# Patient Record
Sex: Male | Born: 1995
Health system: Southern US, Community
[De-identification: ages and names within clinical notes are randomized; demographics above are authoritative.]

## PROBLEM LIST (undated history)

## (undated) DIAGNOSIS — J45909 Unspecified asthma, uncomplicated: Secondary | ICD-10-CM

## (undated) HISTORY — PX: HERNIA REPAIR: SHX51

---

## 2015-11-21 ENCOUNTER — Emergency Department (HOSPITAL_COMMUNITY)
Admission: EM | Admit: 2015-11-21 | Discharge: 2015-11-21 | Disposition: A | Discharge status: Home / Self Care | Payer: Medicaid Other | Attending: Emergency Medicine | Admitting: Emergency Medicine

## 2015-11-21 ENCOUNTER — Encounter (HOSPITAL_COMMUNITY): Payer: Self-pay | Admitting: Emergency Medicine

## 2015-11-21 DIAGNOSIS — J45909 Unspecified asthma, uncomplicated: Secondary | ICD-10-CM | POA: Diagnosis present

## 2015-11-21 DIAGNOSIS — Z8739 Personal history of other diseases of the musculoskeletal system and connective tissue: Secondary | ICD-10-CM | POA: Diagnosis not present

## 2015-11-21 DIAGNOSIS — Z7951 Long term (current) use of inhaled steroids: Secondary | ICD-10-CM | POA: Insufficient documentation

## 2015-11-21 DIAGNOSIS — J45901 Unspecified asthma with (acute) exacerbation: Secondary | ICD-10-CM

## 2015-11-21 MED ORDER — MAGNESIUM SULFATE 2 GM/50ML IV SOLN
2.0000 g | Freq: Once | INTRAVENOUS | Status: AC
Start: 2015-11-21 — End: 2015-11-21
  Administered 2015-11-21: 2 g via INTRAVENOUS
  Filled 2015-11-21: qty 50

## 2015-11-21 MED ORDER — ALBUTEROL SULFATE (2.5 MG/3ML) 0.083% IN NEBU
5.0000 mg | INHALATION_SOLUTION | Freq: Once | RESPIRATORY_TRACT | Status: AC
  Administered 2015-11-21: 5 mg via RESPIRATORY_TRACT

## 2015-11-21 MED ORDER — IPRATROPIUM-ALBUTEROL 0.5-2.5 (3) MG/3ML IN SOLN: 3.0000 mL | RESPIRATORY_TRACT | Status: DC

## 2015-11-21 MED ORDER — FLUTICASONE PROPIONATE HFA 110 MCG/ACT IN AERO: 1.0000 | INHALATION_SPRAY | Freq: Two times a day (BID) | RESPIRATORY_TRACT | Status: DC

## 2015-11-21 MED ORDER — ALBUTEROL SULFATE HFA 108 (90 BASE) MCG/ACT IN AERS: 1.0000 | INHALATION_SPRAY | Freq: Four times a day (QID) | RESPIRATORY_TRACT | Status: DC | PRN

## 2015-11-21 MED ORDER — ALBUTEROL SULFATE HFA 108 (90 BASE) MCG/ACT IN AERS
2.0000 | INHALATION_SPRAY | RESPIRATORY_TRACT | Status: DC
  Administered 2015-11-21: 2 via RESPIRATORY_TRACT
  Filled 2015-11-21: qty 6.7

## 2015-11-21 MED ORDER — METHYLPREDNISOLONE SODIUM SUCC 125 MG IJ SOLR
125.0000 mg | Freq: Once | INTRAMUSCULAR | Status: AC
  Administered 2015-11-21: 125 mg via INTRAVENOUS
  Filled 2015-11-21: qty 2

## 2015-11-21 MED ORDER — PREDNISONE 20 MG PO TABS: 20.0000 mg | ORAL_TABLET | Freq: Two times a day (BID) | ORAL | Status: DC

## 2015-11-21 MED ORDER — ALBUTEROL (5 MG/ML) CONTINUOUS INHALATION SOLN
10.0000 mg/h | INHALATION_SOLUTION | Freq: Once | RESPIRATORY_TRACT | Status: AC
  Administered 2015-11-21: 10 mg/h via RESPIRATORY_TRACT
  Filled 2015-11-21: qty 20

## 2015-11-21 MED ORDER — ALBUTEROL SULFATE (2.5 MG/3ML) 0.083% IN NEBU
INHALATION_SOLUTION | RESPIRATORY_TRACT | Status: AC
  Filled 2015-11-21: qty 6

## 2015-11-21 MED ORDER — IPRATROPIUM-ALBUTEROL 0.5-2.5 (3) MG/3ML IN SOLN
3.0000 mL | Freq: Once | RESPIRATORY_TRACT | Status: AC
  Administered 2015-11-21: 3 mL via RESPIRATORY_TRACT
  Filled 2015-11-21: qty 3

## 2015-11-21 NOTE — ED Notes (Signed)
Dr. James MD at bedside. 

## 2015-11-21 NOTE — ED Notes (Signed)
Pt arrives from home reporting intermittent wheezing since Thursday.  Pt reports being out of home meds d/t insurance complications.  Inspiratory and expiratory wheezes noted throughout.  Pt unable to speak in full sentences.

## 2015-11-21 NOTE — ED Notes (Signed)
MD at bedside. 

## 2015-11-21 NOTE — Discharge Instructions (Signed)
Asthma, Adult Asthma is a condition of the lungs in which the airways tighten and narrow. Asthma can make it hard to breathe. Asthma cannot be cured, but medicine and lifestyle changes can help control it. Asthma may be started (triggered) by:  Animal skin flakes (dander).  Dust.  Cockroaches.  Pollen.  Mold.  Smoke.  Cleaning products.  Hair sprays or aerosol sprays.  Paint fumes or strong smells.  Cold air, weather changes, and winds.  Crying or laughing hard.  Stress.  Certain medicines or drugs.  Foods, such as dried fruit, potato chips, and sparkling grape juice.  Infections or conditions (colds, flu).  Exercise.  Certain medical conditions or diseases.  Exercise or tiring activities. HOME CARE   Take medicine as told by your doctor.  Use a peak flow meter as told by your doctor. A peak flow meter is a tool that measures how well the lungs are working.  Record and keep track of the peak flow meter's readings.  Understand and use the asthma action plan. An asthma action plan is a written plan for taking care of your asthma and treating your attacks.  To help prevent asthma attacks:  Do not smoke. Stay away from secondhand smoke.  Change your heating and air conditioning filter often.  Limit your use of fireplaces and wood stoves.  Get rid of pests (such as roaches and mice) and their droppings.  Throw away plants if you see mold on them.  Clean your floors. Dust regularly. Use cleaning products that do not smell.  Have someone vacuum when you are not home. Use a vacuum cleaner with a HEPA filter if possible.  Replace carpet with wood, tile, or vinyl flooring. Carpet can trap animal skin flakes and dust.  Use allergy-proof pillows, mattress covers, and box spring covers.  Wash bed sheets and blankets every week in hot water and dry them in a dryer.  Use blankets that are made of polyester or cotton.  Clean bathrooms and kitchens with bleach.  If possible, have someone repaint the walls in these rooms with mold-resistant paint. Keep out of the rooms that are being cleaned and painted.  Wash hands often. GET HELP IF:  You have make a whistling sound when breaking (wheeze), have shortness of breath, or have a cough even if taking medicine to prevent attacks.  The colored mucus you cough up (sputum) is thicker than usual.  The colored mucus you cough up changes from clear or white to yellow, green, gray, or bloody.  You have problems from the medicine you are taking such as:  A rash.  Itching.  Swelling.  Trouble breathing.  You need reliever medicines more than 2-3 times a week.  Your peak flow measurement is still at 50-79% of your personal best after following the action plan for 1 hour.  You have a fever. GET HELP RIGHT AWAY IF:   You seem to be worse and are not responding to medicine during an asthma attack.  You are short of breath even at rest.  You get short of breath when doing very little activity.  You have trouble eating, drinking, or talking.  You have chest pain.  You have a fast heartbeat.  Your lips or fingernails start to turn blue.  You are light-headed, dizzy, or faint.  Your peak flow is less than 50% of your personal best.   This information is not intended to replace advice given to you by your health care provider. Make sure   you discuss any questions you have with your health care provider.   Document Released: 03/13/2008 Document Revised: 06/16/2015 Document Reviewed: 04/24/2013 Elsevier Interactive Patient Education 2016 Elsevier Inc.  

## 2015-11-21 NOTE — ED Provider Notes (Signed)
CSN: [id number]     Arrival date & time 11/21/15  0703 History   First MD Initiated Contact with Patient 11/21/15 [phone]     Chief Complaint  Patient presents with  . Asthma      HPI  Patient presents for evaluation of shortness of breath. History of asthma. Wheezing for the last 4-5 days. Out of his home nebulized albuterol solution. Wheezing and frequent cough area 2-3 word dyspnea. Presents here.  Past Medical History  Diagnosis Date  . Arthritis    Past Surgical History  Procedure Laterality Date  . Hernia repair     History reviewed. No pertinent family history. Social History  Substance Use Topics  . Smoking status: Never Smoker   . Smokeless tobacco: None  . Alcohol Use: Yes     Comment: occasionally    Review of Systems  Constitutional: Negative for fever, chills, diaphoresis, appetite change and fatigue.  HENT: Negative for mouth sores, sore throat and trouble swallowing.   Eyes: Negative for visual disturbance.  Respiratory: Positive for cough, shortness of breath and wheezing. Negative for chest tightness.   Cardiovascular: Negative for chest pain.  Gastrointestinal: Negative for nausea, vomiting, abdominal pain, diarrhea and abdominal distention.  Endocrine: Negative for polydipsia, polyphagia and polyuria.  Genitourinary: Negative for dysuria, frequency and hematuria.  Musculoskeletal: Negative for gait problem.  Skin: Negative for color change, pallor and rash.  Neurological: Negative for dizziness, syncope, light-headedness and headaches.  Hematological: Does not bruise/bleed easily.  Psychiatric/Behavioral: Negative for behavioral problems and confusion.      Allergies  Shellfish allergy  Home Medications   Prior to Admission medications   Medication Sig Start Date End Date Taking? Authorizing Provider  beclomethasone (QVAR) 80 MCG/ACT inhaler Inhale 2 puffs into the lungs 2 (two) times daily.   Yes Historical Provider, MD  albuterol (PROVENTIL  HFA;VENTOLIN HFA) 108 (90 Base) MCG/ACT inhaler Inhale 1-2 puffs into the lungs every 6 (six) hours as needed for wheezing. 11/21/15   Rolland Porter, MD  fluticasone (FLOVENT HFA) 110 MCG/ACT inhaler Inhale 1 puff into the lungs 2 (two) times daily. 11/21/15   Rolland Porter, MD  predniSONE (DELTASONE) 20 MG tablet Take 1 tablet (20 mg total) by mouth 2 (two) times daily with a meal. 11/21/15   Rolland Porter, MD   BP 134/67 mmHg  Pulse 97  Temp(Src) 98.3 F (36.8 C) (Oral)  Resp 22  SpO2 97% Physical Exam  Constitutional: He is oriented to person, place, and time. He appears well-developed and well-nourished. No distress.  HENT:  Head: Normocephalic.  Eyes: Conjunctivae are normal. Pupils are equal, round, and reactive to light. No scleral icterus.  Neck: Normal range of motion. Neck supple. No thyromegaly present.  Cardiovascular: Normal rate and regular rhythm.  Exam reveals no gallop and no friction rub.   No murmur heard. Pulmonary/Chest: He is in respiratory distress. He has decreased breath sounds in the right upper field, the right middle field, the right lower field, the left upper field, the left middle field and the left lower field. He has wheezes in the right upper field, the right middle field, the right lower field, the left upper field, the left middle field and the left lower field. He has no rales.  Abdominal: Soft. Bowel sounds are normal. He exhibits no distension. There is no tenderness. There is no rebound.  Musculoskeletal: Normal range of motion.  Neurological: He is alert and oriented to person, place, and time.  Skin: Skin is warm  and dry. No rash noted.  Psychiatric: He has a normal mood and affect. His behavior is normal.    ED Course  Procedures (including critical care time) Labs Review Labs Reviewed - No data to display  Imaging Review No results found. I have personally reviewed and evaluated these images and lab results as part of my medical decision-making.    EKG Interpretation None      MDM   Final diagnoses:  Asthma exacerbation    Nebulizer albuterol started by nursing upon arrival. After my exam, I have requested a continuous 10 mg neb and IV Solu-Medrol. We will reevaluate after bronchodilators.  Given 10 mg continuous. The observed for one hour. Given an additional DuoNeb. Had marked improvement. He has some residual index for wheezing. Well oxygenated 95%. Resting sleeping without desaturation. He has a strong desire not to stay in the hospital. Is not unreasonable. Plan is home. Minimize his activity. Prednisone, Flovent, albuterol, recheck any worsening. CRITICAL CARE Performed by: Rolland Porter JOSEPH   Total critical care time: 65 minutes  Critical care time was exclusive of separately billable procedures and treating other patients.  Critical care was necessary to treat or prevent imminent or life-threatening deterioration.  Critical care was time spent personally by me on the following activities: development of treatment plan with patient and/or surrogate as well as nursing, discussions with consultants, evaluation of patient's response to treatment, examination of patient, obtaining history from patient or surrogate, ordering and performing treatments and interventions, ordering and review of laboratory studies, ordering and review of radiographic studies, pulse oximetry and re-evaluation of patient's condition. care    Rolland Porter, MD 11/21/15 1150

## 2015-12-26 ENCOUNTER — Emergency Department (HOSPITAL_COMMUNITY)
Admission: EM | Admit: 2015-12-26 | Discharge: 2015-12-26 | Disposition: A | Discharge status: Home / Self Care | Payer: Medicaid Other | Attending: Emergency Medicine | Admitting: Emergency Medicine

## 2015-12-26 ENCOUNTER — Encounter (HOSPITAL_COMMUNITY): Payer: Self-pay | Admitting: Emergency Medicine

## 2015-12-26 DIAGNOSIS — Z7952 Long term (current) use of systemic steroids: Secondary | ICD-10-CM | POA: Insufficient documentation

## 2015-12-26 DIAGNOSIS — Z79899 Other long term (current) drug therapy: Secondary | ICD-10-CM | POA: Insufficient documentation

## 2015-12-26 DIAGNOSIS — J45901 Unspecified asthma with (acute) exacerbation: Secondary | ICD-10-CM | POA: Insufficient documentation

## 2015-12-26 DIAGNOSIS — L509 Urticaria, unspecified: Secondary | ICD-10-CM | POA: Insufficient documentation

## 2015-12-26 DIAGNOSIS — Z7951 Long term (current) use of inhaled steroids: Secondary | ICD-10-CM | POA: Diagnosis not present

## 2015-12-26 DIAGNOSIS — R21 Rash and other nonspecific skin eruption: Secondary | ICD-10-CM | POA: Diagnosis present

## 2015-12-26 MED ORDER — LORATADINE 10 MG PO TABS: 10.0000 mg | ORAL_TABLET | Freq: Every day | ORAL | Status: DC

## 2015-12-26 MED ORDER — PREDNISONE 50 MG PO TABS: 50.0000 mg | ORAL_TABLET | Freq: Every day | ORAL | Status: DC

## 2015-12-26 MED ORDER — LORATADINE 10 MG PO TABS
10.0000 mg | ORAL_TABLET | Freq: Every day | ORAL | Status: DC
  Administered 2015-12-26: 10 mg via ORAL
  Filled 2015-12-26: qty 1

## 2015-12-26 MED ORDER — EPINEPHRINE 0.3 MG/0.3ML IJ SOAJ: 0.3000 mg | Freq: Once | INTRAMUSCULAR | Status: DC

## 2015-12-26 MED ORDER — PREDNISONE 20 MG PO TABS
60.0000 mg | ORAL_TABLET | Freq: Once | ORAL | Status: AC
  Administered 2015-12-26: 60 mg via ORAL
  Filled 2015-12-26: qty 3

## 2015-12-26 NOTE — ED Notes (Addendum)
Pt c/o hives all over body. Pt reports history of same "since 8th grade when the weather gets hot outside." Pt reports itching. Hives noted to face, B/L eyes and arms. Pt denies use of new products.  Pt reports shortness of breath. 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

## 2015-12-26 NOTE — Discharge Instructions (Signed)
Hives Hives are itchy, red, swollen areas of the skin. They can vary in size and location on your body. Hives can come and go for hours or several days (acute hives) or for several weeks (chronic hives). Hives do not spread from person to person (noncontagious). They may get worse with scratching, exercise, and emotional stress. CAUSES   Allergic reaction to food, additives, or drugs.  Infections, including the common cold.  Illness, such as vasculitis, lupus, or thyroid disease.  Exposure to sunlight, heat, or cold.  Exercise.  Stress.  Contact with chemicals. SYMPTOMS   Red or white swollen patches on the skin. The patches may change size, shape, and location quickly and repeatedly.  Itching.  Swelling of the hands, feet, and face. This may occur if hives develop deeper in the skin. DIAGNOSIS  Your caregiver can usually tell what is wrong by performing a physical exam. Skin or blood tests may also be done to determine the cause of your hives. In some cases, the cause cannot be determined. TREATMENT  Mild cases usually get better with medicines such as antihistamines. Severe cases may require an emergency epinephrine injection. If the cause of your hives is known, treatment includes avoiding that trigger.  HOME CARE INSTRUCTIONS   Avoid causes that trigger your hives.  Take antihistamines as directed by your caregiver to reduce the severity of your hives. Non-sedating or low-sedating antihistamines are usually recommended. Do not drive while taking an antihistamine.  Take any other medicines prescribed for itching as directed by your caregiver.  Wear loose-fitting clothing.  Keep all follow-up appointments as directed by your caregiver. SEEK MEDICAL CARE IF:   You have persistent or severe itching that is not relieved with medicine.  You have painful or swollen joints. SEEK IMMEDIATE MEDICAL CARE IF:   You have a fever.  Your tongue or lips are swollen.  You have  trouble breathing or swallowing.  You feel tightness in the throat or chest.  You have abdominal pain. These problems may be the first sign of a life-threatening allergic reaction. Call your local emergency services (911 in [place] MAKE SURE YOU:   Understand these instructions.  Will watch your condition.  Will get help right away if you are not doing well or get worse.   This information is not intended to replace advice given to you by your health care provider. Make sure you discuss any questions you have with your health care provider.   Document Released: 09/25/2005 Document Revised: 09/30/2013 Document Reviewed: 12/19/2011 Elsevier Interactive Patient Education 2016 Elsevier Inc.  

## 2015-12-26 NOTE — ED Provider Notes (Signed)
CSN: [id number]     Arrival date & time 12/26/15  1728 History   First MD Initiated Contact with Patient 12/26/15 1809     Chief Complaint  Patient presents with  . Urticaria    HPI Has had a similar rash off and on since 8th year.    He has a seafood allergy.  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  These symptoms stgarted again a few days ago.  When it starts initially he will notice a rash on his arms that starts spreading.  The rashes itches.  He feels a little short of breath when it happens.  He gets swelling around the face.  He did not  take anything but it has gotten a little bit better than it was earlier today. He does have a history of asthma. History reviewed. No pertinent past medical history. Past Surgical History  Procedure Laterality Date  . Hernia repair     No family history on file. Social History  Substance Use Topics  . Smoking status: Never Smoker   . Smokeless tobacco: None  . Alcohol Use: Yes     Comment: occasionally    Review of Systems  All other systems reviewed and are negative.     Allergies  Shellfish allergy  Home Medications   Prior to Admission medications   Medication Sig Start Date End Date Taking? Authorizing Provider  albuterol (PROVENTIL HFA;VENTOLIN HFA) 108 (90 Base) MCG/ACT inhaler Inhale 1-2 puffs into the lungs every 6 (six) hours as needed for wheezing. 11/21/15   Rolland PorterMark James, MD  beclomethasone (QVAR) 80 MCG/ACT inhaler Inhale 2 puffs into the lungs 2 (two) times daily.    Historical Provider, MD  fluticasone (FLOVENT HFA) 110 MCG/ACT inhaler Inhale 1 puff into the lungs 2 (two) times daily. 11/21/15   Rolland PorterMark James, MD  predniSONE (DELTASONE) 20 MG tablet Take 1 tablet (20 mg total) by mouth 2 (two) times daily with a meal. 11/21/15   Rolland PorterMark James, MD   BP 129/85 mmHg  Pulse 119  Temp(Src) 98.7 F (37.1 C) (Oral)  Resp 18  Ht 5\' 7"  (1.702 m)  Wt 67.359 kg  BMI 23.25 kg/m2  SpO2  94% Physical Exam  Constitutional: He appears well-developed and well-nourished. No distress.  HENT:  Head: Normocephalic and atraumatic.  Right Ear: External ear normal.  Left Ear: External ear normal.  Mouth/Throat: No oropharyngeal exudate.  No uvula edema, no angioedema   Eyes: Conjunctivae are normal. Right eye exhibits no discharge. Left eye exhibits no discharge. No scleral icterus.  Neck: Neck supple. No tracheal deviation present.  Cardiovascular: Normal rate, regular rhythm and intact distal pulses.   Pulmonary/Chest: Effort normal and breath sounds normal. No stridor. No respiratory distress. He has no wheezes. He has no rales.  Abdominal: Soft. Bowel sounds are normal. He exhibits no distension. There is no tenderness. There is no rebound and no guarding.  Musculoskeletal: He exhibits no edema or tenderness.  Neurological: He is alert. He has normal strength. No cranial nerve deficit (no facial droop, extraocular movements intact, no slurred speech) or sensory deficit. He exhibits normal muscle tone. He displays no seizure activity. Coordination normal.  Skin: Skin is warm and dry. Rash noted.  Mild urticaria rash   Psychiatric: He has a normal mood and affect.  Nursing note and vitals reviewed.   ED Course  Procedures (including critical care time)   MDM   Final  diagnoses:  Urticaria    Pt has mild urticaria on exam which seems to be improving.  Unclear what the trigger is.  Will give steroids and antihistamines.  Discussed outpatient referral to an allergist.    Linwood Dibbles, MD 12/26/15 [phone]

## 2017-01-28 DIAGNOSIS — J45909 Unspecified asthma, uncomplicated: Secondary | ICD-10-CM | POA: Insufficient documentation

## 2017-01-28 DIAGNOSIS — Z79899 Other long term (current) drug therapy: Secondary | ICD-10-CM | POA: Diagnosis not present

## 2017-01-28 DIAGNOSIS — J4541 Moderate persistent asthma with (acute) exacerbation: Secondary | ICD-10-CM | POA: Insufficient documentation

## 2017-01-28 DIAGNOSIS — R0602 Shortness of breath: Secondary | ICD-10-CM | POA: Diagnosis present

## 2017-01-29 ENCOUNTER — Encounter (HOSPITAL_COMMUNITY): Payer: Self-pay

## 2017-01-29 ENCOUNTER — Emergency Department (HOSPITAL_COMMUNITY): Payer: Medicaid Other

## 2017-01-29 ENCOUNTER — Emergency Department (HOSPITAL_COMMUNITY)
Admission: EM | Admit: 2017-01-29 | Discharge: 2017-01-29 | Disposition: A | Discharge status: Home / Self Care | Payer: Medicaid Other | Attending: Emergency Medicine | Admitting: Emergency Medicine

## 2017-01-29 DIAGNOSIS — J4541 Moderate persistent asthma with (acute) exacerbation: Secondary | ICD-10-CM

## 2017-01-29 HISTORY — DX: Unspecified asthma, uncomplicated: J45.909

## 2017-01-29 MED ORDER — PREDNISONE 20 MG PO TABS
60.0000 mg | ORAL_TABLET | Freq: Once | ORAL | Status: AC
  Administered 2017-01-29: 60 mg via ORAL
  Filled 2017-01-29: qty 3

## 2017-01-29 MED ORDER — PREDNISONE 10 MG PO TABS: 60.0000 mg | ORAL_TABLET | Freq: Every day | ORAL | 0 refills | Status: DC

## 2017-01-29 MED ORDER — ALBUTEROL SULFATE HFA 108 (90 BASE) MCG/ACT IN AERS: 2.0000 | INHALATION_SPRAY | RESPIRATORY_TRACT | 0 refills | Status: DC | PRN

## 2017-01-29 MED ORDER — ALBUTEROL SULFATE (2.5 MG/3ML) 0.083% IN NEBU
INHALATION_SOLUTION | RESPIRATORY_TRACT | Status: AC
  Filled 2017-01-29: qty 6

## 2017-01-29 MED ORDER — ALBUTEROL SULFATE (2.5 MG/3ML) 0.083% IN NEBU
5.0000 mg | INHALATION_SOLUTION | Freq: Once | RESPIRATORY_TRACT | Status: AC
  Administered 2017-01-29: 5 mg via RESPIRATORY_TRACT

## 2017-01-29 MED ORDER — ALBUTEROL SULFATE HFA 108 (90 BASE) MCG/ACT IN AERS
2.0000 | INHALATION_SPRAY | RESPIRATORY_TRACT | Status: DC
  Administered 2017-01-29: 2 via RESPIRATORY_TRACT
  Filled 2017-01-29: qty 6.7

## 2017-01-29 NOTE — ED Notes (Signed)
Patient transported to X-ray 

## 2017-01-29 NOTE — ED Triage Notes (Signed)
Pt states he is having asthma flare x 2 days; pt states out of inhaler at home; pt presents with wheezes in all fields; pt c/o chest discomfort from breathing at 6/10; pt a&ox 4 on arrival. Pt c/o SOB.

## 2017-01-29 NOTE — ED Notes (Signed)
Pt returned from xray

## 2017-01-29 NOTE — ED Provider Notes (Signed)
[facility]-EMERGENCY DEPT Provider Note   CSN: [id number] Arrival date & time: 01/28/17  2355  By signing my name below, I, Elder Negus, attest that this documentation has been prepared under the direction and in the presence of Azalia Bilis, MD. Electronically Signed: Elder Negus, Scribe. 01/29/17. 12:34 AM.   History   Chief Complaint Chief Complaint  Patient presents with  . Asthma  . Shortness of Breath    HPI Mario Griffith is a 21 y.o. male with history of asthma who presents to the ED for evaluation of dyspnea. This patient states that in the last 48 hours he has experienced increased shortness of breath which has not been improved on inhaler treatments. He has also had productive cough. No fever. No chest pain.  The history is provided by the patient. No language interpreter was used.    Past Medical History:  Diagnosis Date  . Asthma     There are no active problems to display for this patient.   Past Surgical History:  Procedure Laterality Date  . HERNIA REPAIR         Home Medications    Prior to Admission medications   Medication Sig Start Date End Date Taking? Authorizing Provider  albuterol (PROVENTIL HFA;VENTOLIN HFA) 108 (90 Base) MCG/ACT inhaler Inhale 1-2 puffs into the lungs every 6 (six) hours as needed for wheezing. 11/21/15   Rolland Porter, MD  beclomethasone (QVAR) 80 MCG/ACT inhaler Inhale 2 puffs into the lungs 2 (two) times daily.    Historical Provider, MD  EPINEPHrine 0.3 mg/0.3 mL IJ SOAJ injection Inject 0.3 mLs (0.3 mg total) into the muscle once. 12/26/15   Linwood Dibbles, MD  fluticasone (FLOVENT HFA) 110 MCG/ACT inhaler Inhale 1 puff into the lungs 2 (two) times daily. 11/21/15   Rolland Porter, MD  loratadine (CLARITIN) 10 MG tablet Take 1 tablet (10 mg total) by mouth daily. 12/26/15   Linwood Dibbles, MD  predniSONE (DELTASONE) 50 MG tablet Take 1 tablet (50 mg total) by mouth daily with breakfast. 12/26/15   Linwood Dibbles, MD    Family History No  family history on file.  Social History Social History  Substance Use Topics  . Smoking status: Never Smoker  . Smokeless tobacco: Not on file  . Alcohol use Yes     Comment: occasionally     Allergies   Shellfish allergy   Review of Systems Review of Systems All systems reviewed and are negative for acute change except as noted in the HPI.  Physical Exam Updated Vital Signs BP 126/82 (BP Location: Right Arm)   Pulse 91   Temp 98.7 F (37.1 C) (Oral)   Resp 18   SpO2 100%   Physical Exam  Constitutional: He is oriented to person, place, and time. He appears well-developed and well-nourished.  HENT:  Head: Normocephalic and atraumatic.  Eyes: EOM are normal.  Neck: Normal range of motion.  Cardiovascular: Normal rate, regular rhythm, normal heart sounds and intact distal pulses.   Pulmonary/Chest: Effort normal. No respiratory distress.  Mild wheezing bilaterally.   Abdominal: Soft. He exhibits no distension. There is no tenderness.  Musculoskeletal: Normal range of motion.  Neurological: He is alert and oriented to person, place, and time.  Skin: Skin is warm and dry.  Psychiatric: He has a normal mood and affect. Judgment normal.  Nursing note and vitals reviewed.    ED Treatments / Results  Labs (all labs ordered are listed, but only abnormal results are displayed) Labs Reviewed -  No data to display  EKG  EKG Interpretation  Date/Time:  Monday January 29 2017 00:18:27 EDT Ventricular Rate:  91 PR Interval:  178 QRS Duration: 90 QT Interval:  344 QTC Calculation: 423 R Axis:   -10 Text Interpretation:  Normal sinus rhythm with sinus arrhythmia Biatrial enlargement Pulmonary disease pattern Abnormal ECG No old tracing to compare Confirmed by Shanikia Kernodle  MD, Caryn Bee ([id number]) on 01/29/2017 1:54:43 AM       Radiology Dg Chest 2 View  Result Date: 01/29/2017 CLINICAL DATA:  Dyspnea and cough.  Chest pain x2 days. EXAM: CHEST  2 VIEW COMPARISON:  None. FINDINGS:  The heart size and mediastinal contours are within normal limits. Both lungs are clear. The visualized skeletal structures are unremarkable. IMPRESSION: No active cardiopulmonary disease. Electronically Signed   By: Tollie Eth M.D.   On: 01/29/2017 01:40    Procedures Procedures (including critical care time)  Medications Ordered in ED Medications  albuterol (PROVENTIL) (2.5 MG/3ML) 0.083% nebulizer solution (not administered)  albuterol (PROVENTIL HFA;VENTOLIN HFA) 108 (90 Base) MCG/ACT inhaler 2 puff (not administered)  albuterol (PROVENTIL) (2.5 MG/3ML) 0.083% nebulizer solution 5 mg (5 mg Nebulization Given 01/29/17 0021)  predniSONE (DELTASONE) tablet 60 mg (60 mg Oral Given 01/29/17 0042)     Initial Impression / Assessment and Plan / ED Course  I have reviewed the triage vital signs and the nursing notes.  Pertinent labs & imaging results that were available during my care of the patient were reviewed by me and considered in my medical decision making (see chart for details).     Patient feels much better this time.  Discharge home with prednisone and albuterol.  He understands return the ER for new or worsening symptoms  Final Clinical Impressions(s) / ED Diagnoses   Final diagnoses:  Moderate persistent asthma with exacerbation    New Prescriptions New Prescriptions   ALBUTEROL (PROVENTIL HFA;VENTOLIN HFA) 108 (90 BASE) MCG/ACT INHALER    Inhale 2 puffs into the lungs every 4 (four) hours as needed for wheezing or shortness of breath.   PREDNISONE (DELTASONE) 10 MG TABLET    Take 6 tablets (60 mg total) by mouth daily.  I personally performed the services described in this documentation, which was scribed in my presence. The recorded information has been reviewed and is accurate.       Azalia Bilis, MD 01/29/17 [id number]

## 2017-05-11 ENCOUNTER — Emergency Department (HOSPITAL_COMMUNITY)
Admission: EM | Admit: 2017-05-11 | Discharge: 2017-05-12 | Disposition: A | Discharge status: Home / Self Care | Payer: Medicaid Other | Attending: Emergency Medicine | Admitting: Emergency Medicine

## 2017-05-11 ENCOUNTER — Encounter (HOSPITAL_COMMUNITY): Payer: Self-pay | Admitting: *Deleted

## 2017-05-11 DIAGNOSIS — J45909 Unspecified asthma, uncomplicated: Secondary | ICD-10-CM | POA: Insufficient documentation

## 2017-05-11 DIAGNOSIS — Z91013 Allergy to seafood: Secondary | ICD-10-CM | POA: Insufficient documentation

## 2017-05-11 DIAGNOSIS — Z79899 Other long term (current) drug therapy: Secondary | ICD-10-CM | POA: Insufficient documentation

## 2017-05-11 DIAGNOSIS — Z760 Encounter for issue of repeat prescription: Secondary | ICD-10-CM | POA: Insufficient documentation

## 2017-05-11 MED ORDER — ALBUTEROL SULFATE HFA 108 (90 BASE) MCG/ACT IN AERS: 1.0000 | INHALATION_SPRAY | Freq: Four times a day (QID) | RESPIRATORY_TRACT | 0 refills | Status: DC | PRN

## 2017-05-11 MED ORDER — ALBUTEROL SULFATE HFA 108 (90 BASE) MCG/ACT IN AERS
2.0000 | INHALATION_SPRAY | Freq: Once | RESPIRATORY_TRACT | Status: AC
  Administered 2017-05-12: 2 via RESPIRATORY_TRACT
  Filled 2017-05-11: qty 6.7

## 2017-05-11 NOTE — ED Triage Notes (Signed)
The pt is here from [place] hes in school  He lost his medicaid in April he has asthma but he is not having any problem  Breathing.  hes here to fer rxs forf asthma  He has not had meds since april

## 2017-05-11 NOTE — ED Provider Notes (Signed)
[facility] DEPT Provider Note   CSN: [id number] Arrival date & time: 05/11/17  2313     History   Chief Complaint Chief Complaint  Patient presents with  . Medication Refill    HPI Mario Griffith is a 21 y.o. male with history of asthma, presenting for medication refill of albuterol inhaler. Patient states he has not had albuterol since his last ED visit in April. He states he has been having intermittent wheezing for the past few weeks, and has been using her family members inhaler. He states he moved to [place] for school, [facility], however he lost his primary care when he turned 18. He states he also recently lost his [employer] insurance. He reports intermittent wheezing, and cough in the evening. He states he is not currently short of breath or having difficulty breathing in the ER. No other complaints today.  The history is provided by the patient.    Past Medical History:  Diagnosis Date  . Asthma     There are no active problems to display for this patient.   Past Surgical History:  Procedure Laterality Date  . HERNIA REPAIR         Home Medications    Prior to Admission medications   Medication Sig Start Date End Date Taking? Authorizing Provider  albuterol (PROVENTIL HFA;VENTOLIN HFA) 108 (90 Base) MCG/ACT inhaler Inhale 1-2 puffs into the lungs every 6 (six) hours as needed for wheezing or shortness of breath. 05/11/17   Russo, [place]azil[place] N, PA-C  EPINEPHrine 0.3 mg/0.3 mL IJ SOAJ injection Inject 0.3 mLs (0.3 mg total) into the muscle once. Patient not taking: Reported on 01/29/2017 12/26/15   Linwood DibblesKnapp, Jon, MD  loratadine (CLARITIN) 10 MG tablet Take 1 tablet (10 mg total) by mouth daily. Patient not taking: Reported on 01/29/2017 12/26/15   Linwood DibblesKnapp, Jon, MD  predniSONE (DELTASONE) 10 MG tablet Take 6 tablets (60 mg total) by mouth daily. 01/29/17   Azalia Bilisampos, Kevin, MD    Family History No family history on  file.  Social History Social History  Substance Use Topics  . Smoking status: Never Smoker  . Smokeless tobacco: Never Used  . Alcohol use Yes     Comment: occasionally     Allergies   Shellfish allergy   Review of Systems Review of Systems  Constitutional: Negative for fever.  Respiratory: Positive for cough and wheezing. Negative for chest tightness and shortness of breath.   Cardiovascular: Negative for chest pain.  All other systems reviewed and are negative.    Physical Exam Updated Vital Signs BP 114/78   Pulse 94   Temp 98.2 F (36.8 C) (Oral)   Resp 18   Ht 5\' 7"  (1.702 m)   Wt 68 kg (150 lb)   SpO2 98%   BMI 23.49 kg/m   Physical Exam  Constitutional: He appears well-developed and well-nourished.  Patient is well-appearing, nondistressed, no increased work of breathing  HENT:  Head: Normocephalic and atraumatic.  Mouth/Throat: Oropharynx is clear and moist.  Eyes: Conjunctivae are normal.  Cardiovascular: Normal rate, regular rhythm, normal heart sounds and intact distal pulses.  Exam reveals no friction rub.   No murmur heard. Pulmonary/Chest: Effort normal. No respiratory distress. He has wheezes (Bilateral inspiratory and expiratory wheezes). He has no rales. He exhibits no tenderness.  Skin: Skin is warm.  Psychiatric: He has a normal mood and affect. His behavior is normal.  Nursing note and vitals reviewed.  ED Treatments / Results  Labs (all labs ordered are listed, but only abnormal results are displayed) Labs Reviewed - No data to display  EKG  EKG Interpretation None       Radiology No results found.  Procedures Procedures (including critical care time)  Medications Ordered in ED Medications  albuterol (PROVENTIL HFA;VENTOLIN HFA) 108 (90 Base) MCG/ACT inhaler 2 puff (not administered)     Initial Impression / Assessment and Plan / ED Course  I have reviewed the triage vital signs and the nursing notes.  Pertinent  labs & imaging results that were available during my care of the patient were reviewed by me and considered in my medical decision making (see chart for details).     Patient reporting for medication refill of albuterol inhaler. Patient with bilateral wheezing on lung exam, however patient is nondistressed. O2 saturation is 98% on room air, normal vital signs. No increased work of breathing Albuterol inhaler given in ED with improvement in wheezing. Will send with albuterol prescription.  PCP referral given for chronic management of asthma. Patient is not in distress prior to discharge.  Discussed results, findings, treatment and follow up. Patient advised of return precautions. Patient verbalized understanding and agreed with plan.  Final diagnoses:  Medication refill  Mild asthma without complication, unspecified whether persistent    New Prescriptions New Prescriptions   ALBUTEROL (PROVENTIL HFA;VENTOLIN HFA) 108 (90 BASE) MCG/ACT INHALER    Inhale 1-2 puffs into the lungs every 6 (six) hours as needed for wheezing or shortness of breath.     Russo, [place]azil[place] N, PA-C 05/12/17 Rhea [id number]    Zammit, Joseph, MD 05/12/17 [phone]

## 2017-05-12 NOTE — ED Notes (Signed)
Pt verbalized understanding discharge instructions and denies any further needs or questions at this time. VS stable, ambulatory and steady gait.   

## 2017-05-12 NOTE — Discharge Instructions (Signed)
Please read instructions below. Schedule an appointment with [facility] to establish primary care for chronic management of your asthma. You can use 1-2 puffs of the albuterol inhaler every 6 hours as needed for shortness of breath.  Return to the ER for increasing shortness of breath not improved with albuterol, or new or concerning symptoms.

## 2017-06-13 ENCOUNTER — Emergency Department (HOSPITAL_COMMUNITY)
Admission: EM | Admit: 2017-06-13 | Discharge: 2017-06-13 | Disposition: A | Discharge status: Home / Self Care | Payer: Self-pay | Attending: Emergency Medicine | Admitting: Emergency Medicine

## 2017-06-13 ENCOUNTER — Encounter (HOSPITAL_COMMUNITY): Payer: Self-pay | Admitting: *Deleted

## 2017-06-13 DIAGNOSIS — J45901 Unspecified asthma with (acute) exacerbation: Secondary | ICD-10-CM | POA: Insufficient documentation

## 2017-06-13 MED ORDER — PREDNISONE 20 MG PO TABS
40.0000 mg | ORAL_TABLET | Freq: Once | ORAL | Status: AC
  Administered 2017-06-13: 40 mg via ORAL
  Filled 2017-06-13: qty 2

## 2017-06-13 MED ORDER — IPRATROPIUM-ALBUTEROL 0.5-2.5 (3) MG/3ML IN SOLN
3.0000 mL | Freq: Once | RESPIRATORY_TRACT | Status: AC
  Administered 2017-06-13: 3 mL via RESPIRATORY_TRACT
  Filled 2017-06-13: qty 3

## 2017-06-13 MED ORDER — ALBUTEROL SULFATE HFA 108 (90 BASE) MCG/ACT IN AERS
1.0000 | INHALATION_SPRAY | Freq: Once | RESPIRATORY_TRACT | Status: AC
  Administered 2017-06-13: 2 via RESPIRATORY_TRACT
  Filled 2017-06-13: qty 6.7

## 2017-06-13 MED ORDER — ALBUTEROL SULFATE (2.5 MG/3ML) 0.083% IN NEBU: 2.5000 mg | INHALATION_SOLUTION | Freq: Once | RESPIRATORY_TRACT | Status: DC

## 2017-06-13 MED ORDER — ALBUTEROL SULFATE (2.5 MG/3ML) 0.083% IN NEBU
2.5000 mg | INHALATION_SOLUTION | Freq: Once | RESPIRATORY_TRACT | Status: AC
  Administered 2017-06-13: 2.5 mg via RESPIRATORY_TRACT
  Filled 2017-06-13: qty 3

## 2017-06-13 MED ORDER — ALBUTEROL SULFATE HFA 108 (90 BASE) MCG/ACT IN AERS: 1.0000 | INHALATION_SPRAY | Freq: Four times a day (QID) | RESPIRATORY_TRACT | 0 refills | Status: DC | PRN

## 2017-06-13 MED ORDER — PREDNISONE 20 MG PO TABS: 40.0000 mg | ORAL_TABLET | Freq: Every day | ORAL | 0 refills | Status: AC

## 2017-06-13 NOTE — ED Triage Notes (Signed)
Pt reports hx of asthma, is out of his inhaler and having increase in wheezing and sob for over the past week. No acute resp distress is noted at triage.

## 2017-06-13 NOTE — ED Notes (Signed)
See EDP secondary assessment.  

## 2017-06-13 NOTE — ED Provider Notes (Signed)
[facility] Provider Note   CSN: [id number] Arrival date & time: 06/13/17  1401     History   Chief Complaint Chief Complaint  Patient presents with  . Asthma    HPI Mario Griffith is a 21 y.o. male.  HPI   Mario Griffith is a 21yo male with a history of asthma who presents to the Emergency Department for evaluation of wheezing and shortness of breath. He states that he started having to use his albuterol inhaler more often about a week and a half ago. He states that normally he only uses the inhaler a couple of times a week, but over the past week and a half has had to use it 4-5 times a day and sometimes at night. He has not been sleeping well, waking up 2-3 times a night due to shortness of breath. He states that he ran out of his albuterol inhaler today which is why he came in. He also endorses cough with white sputum. No fever, chest pain, rhinorrhea, sinus pain, sore throat, N/V. He does not have a primary care provider, but states that he has an appointment with [facility] this week.   Past Medical History:  Diagnosis Date  . Asthma     There are no active problems to display for this patient.   Past Surgical History:  Procedure Laterality Date  . HERNIA REPAIR         Home Medications    Prior to Admission medications   Medication Sig Start Date End Date Taking? Authorizing Provider  albuterol (PROVENTIL HFA;VENTOLIN HFA) 108 (90 Base) MCG/ACT inhaler Inhale 1-2 puffs into the lungs every 6 (six) hours as needed for wheezing or shortness of breath. 05/11/17   Russo, [place] N, PA-C  albuterol (PROVENTIL HFA;VENTOLIN HFA) 108 (90 Base) MCG/ACT inhaler Inhale 1-2 puffs into the lungs every 6 (six) hours as needed for wheezing or shortness of breath. 06/13/17   Kellie Shropshire, PA-C  EPINEPHrine 0.3 mg/0.3 mL IJ SOAJ injection Inject 0.3 mLs (0.3 mg total) into the muscle once. Patient not taking: Reported on 01/29/2017 12/26/15   Linwood Dibbles, MD  loratadine  (CLARITIN) 10 MG tablet Take 1 tablet (10 mg total) by mouth daily. Patient not taking: Reported on 01/29/2017 12/26/15   Linwood Dibbles, MD  predniSONE (DELTASONE) 10 MG tablet Take 6 tablets (60 mg total) by mouth daily. 01/29/17   Azalia Bilis, MD  predniSONE (DELTASONE) 20 MG tablet Take 2 tablets (40 mg total) by mouth daily. 06/13/17 06/17/17  Kellie Shropshire, PA-C    Family History History reviewed. No pertinent family history.  Social History Social History  Substance Use Topics  . Smoking status: Never Smoker  . Smokeless tobacco: Never Used  . Alcohol use Yes     Comment: occasionally     Allergies   Shellfish allergy   Review of Systems Review of Systems  Constitutional: Negative for chills, fatigue and fever.  HENT: Negative for ear pain, postnasal drip and sore throat.   Eyes: Negative for itching.  Respiratory: Positive for cough (productive white sputum), chest tightness, shortness of breath and wheezing.   Cardiovascular: Negative for chest pain and palpitations.  Gastrointestinal: Negative for abdominal pain, diarrhea, nausea and vomiting.  Genitourinary: Negative for dysuria.  Neurological: Negative for headaches.     Physical Exam Updated Vital Signs BP 119/80 (BP Location: Right Arm)   Pulse 74   Temp 98.1 F (36.7 C) (Oral)   Resp 16  Ht 5\' 8"  (1.727 m)   Wt 70.8 kg (156 lb)   SpO2 98%   BMI 23.72 kg/m   Physical Exam  Constitutional: He is oriented to person, place, and time. He appears well-developed and well-nourished. No distress.  HENT:  Head: Normocephalic and atraumatic.  Nose: Nose normal.  Mouth/Throat: Oropharynx is clear and moist. No oropharyngeal exudate.  Eyes: Pupils are equal, round, and reactive to light. Conjunctivae are normal. Right eye exhibits no discharge. Left eye exhibits no discharge.  Neck: Normal range of motion. Neck supple.  Cardiovascular: Normal rate and regular rhythm.  Exam reveals no friction rub.   No murmur  heard. Pulmonary/Chest: Effort normal. No respiratory distress. He has no rales. He exhibits no tenderness.  Expiratory wheezes throughout bilateral lung fields.   Abdominal: Soft. Bowel sounds are normal. There is no tenderness.  Neurological: He is alert and oriented to person, place, and time. Coordination normal.  Skin: Skin is warm and dry. He is not diaphoretic.  Psychiatric: He has a normal mood and affect. His behavior is normal.  Nursing note and vitals reviewed.    ED Treatments / Results  Labs (all labs ordered are listed, but only abnormal results are displayed) Labs Reviewed - No data to display  EKG  EKG Interpretation None       Radiology No results found.  Procedures Procedures (including critical care time)  Medications Ordered in ED Medications  albuterol (PROVENTIL) (2.5 MG/3ML) 0.083% nebulizer solution 2.5 mg (2.5 mg Nebulization Given 06/13/17 1803)  ipratropium-albuterol (DUONEB) 0.5-2.5 (3) MG/3ML nebulizer solution 3 mL (3 mLs Nebulization Given 06/13/17 1840)  predniSONE (DELTASONE) tablet 40 mg (40 mg Oral Given 06/13/17 1840)     Initial Impression / Assessment and Plan / ED Course  I have reviewed the triage vital signs and the nursing notes.  Pertinent labs & imaging results that were available during my care of the patient were reviewed by me and considered in my medical decision making (see chart for details).    Patient ambulated in ED with O2 saturations maintained >90, no current signs of respiratory distress. Lung exam improved after 2 nebulizer treatments. Prednisone given in the ED and pt will be discharged with 5 day burst. Patient also prescribed albuterol, since he recently ran out. Pt states they are breathing at baseline. Pt has been instructed to continue using prescribed medications and to speak with PCP about today's exacerbation. Patient agrees with plan and voices understanding.    Final Clinical Impressions(s) / ED Diagnoses    Final diagnoses:  Exacerbation of asthma, unspecified asthma severity, unspecified whether persistent    New Prescriptions New Prescriptions   ALBUTEROL (PROVENTIL HFA;VENTOLIN HFA) 108 (90 BASE) MCG/ACT INHALER    Inhale 1-2 puffs into the lungs every 6 (six) hours as needed for wheezing or shortness of breath.   PREDNISONE (DELTASONE) 20 MG TABLET    Take 2 tablets (40 mg total) by mouth daily.     Kellie ShropshireShrosbree, Emily J, PA-C 06/13/17 1918    Tegeler, Canary Brimhristopher J, MD 06/13/17 [phone]

## 2017-06-13 NOTE — Discharge Instructions (Signed)
Fill your prescription for albuterol and take as needed for wheezing and shortness of breath.  Also fill prescription for prednisone and take two tablets (40mg ) once a day for the next four days. You have gotten one dose in the ER today.   Please return to the ER if you have worsening shortness of breath, wheezing, difficulty moving air.

## 2017-06-20 NOTE — Progress Notes (Signed)
Patient ID: Mario Griffith, male   DOB: Feb 14, 1996, 21 y.o.   MRN: [id number]   Mario Griffith, is a 21 y.o. male  VFI:[id number]  CZY:[id number]  DOB - 02-16-1996  Subjective:  Chief Complaint and HPI: Mario Griffith is a 21 y.o. male here today to establish care and for a follow up visit  After being seen in the ED 06/13/2017 for asthma exacerbation.  Treated with duonebs and discharged on 5 days of prednisone and albuterol.  He didn't get the prednisone filled.    He was previously on QVar and Flovent but lost medicaid.  Since then, he has had to use his albuterol ~6 times daily.  Non-smoker but did smoke previously.    No f/c. ED/Hospital notes reviewed.    ROS:   Constitutional:  No f/c, No night sweats, No unexplained weight loss. EENT:  No vision changes, No blurry vision, No hearing changes. No mouth, throat, or ear problems.  Respiratory: No cough, + SOB/wheezing Cardiac: No CP, no palpitations GI:  No abd pain, No N/V/D. GU: No Urinary s/sx Musculoskeletal: No joint pain Neuro: No headache, no dizziness, no motor weakness.  Skin: No rash Endocrine:  No polydipsia. No polyuria.  Psych: Denies SI/HI  No problems updated.  ALLERGIES: Allergies  Allergen Reactions  . Shellfish Allergy Anaphylaxis and Swelling    PAST MEDICAL HISTORY: Past Medical History:  Diagnosis Date  . Asthma     MEDICATIONS AT HOME: Prior to Admission medications   Medication Sig Start Date End Date Taking? Authorizing Provider  albuterol (PROVENTIL HFA;VENTOLIN HFA) 108 (90 Base) MCG/ACT inhaler Inhale 1-2 puffs into the lungs every 6 (six) hours as needed for wheezing or shortness of breath. 06/13/17  Yes Kellie Shropshire, PA-C  albuterol (PROVENTIL HFA;VENTOLIN HFA) 108 (90 Base) MCG/ACT inhaler Inhale 1-2 puffs into the lungs every 6 (six) hours as needed for wheezing or shortness of breath. 05/11/17   Russo, [place] N, PA-C  EPINEPHrine 0.3 mg/0.3 mL IJ SOAJ injection Inject 0.3 mLs (0.3 mg  total) into the muscle once. Patient not taking: Reported on 01/29/2017 12/26/15   Linwood Dibbles, MD  Fluticasone-Salmeterol (ADVAIR) 250-50 MCG/DOSE AEPB Inhale 1 puff into the lungs 2 (two) times daily. 06/21/17   Anders Simmonds, PA-C  loratadine (CLARITIN) 10 MG tablet Take 1 tablet (10 mg total) by mouth daily. Patient not taking: Reported on 01/29/2017 12/26/15   Linwood Dibbles, MD  predniSONE (DELTASONE) 10 MG tablet Take 6 tablets (60 mg total) by mouth daily. 01/29/17   Azalia Bilis, MD     Objective:  EXAM:   Vitals:   06/21/17 1032  BP: 115/75  Pulse: 88  Resp: 18  Temp: 98.5 F (36.9 C)  TempSrc: Oral  SpO2: 96%  Weight: 154 lb 9.6 oz (70.1 kg)  Height:  (1.727 m)    General appearance : A&OX3. NAD. Non-toxic-appearing HEENT: Atraumatic and Normocephalic.  PERRLA. EOM intact.  TM clear B. Mouth-MMM, post pharynx WNL w/o erythema, No PND. Neck: supple, no JVD. No cervical lymphadenopathy. No thyromegaly Chest/Lungs:  Breathing-non-labored, Good air entry bilaterally, breath sounds normal without rales, rhonchi, or wheezing> no wheezing at all currently!  He did use albuterol just before coming in. CVS: S1 S2 regular, no murmurs, gallops, rubs  Extremities: Bilateral Lower Ext shows no edema, both legs are warm to touch with = pulse throughout Neurology:  CN II-XII grossly intact, Non focal.   Psych:  TP linear. J/I WNL. Normal speech. Appropriate eye contact  and affect.  Skin:  No Rash  Data Review No results found for: HGBA1C   Assessment & Plan   1. Moderate persistent asthma without complication - Fluticasone-Salmeterol (ADVAIR) 250-50 MCG/DOSE AEPB; Inhale 1 puff into the lungs 2 (two) times daily.  Dispense: 60 each; Refill: 3 Albuterol prn.  Goal is <=2times/week.  May need to add additional agents but will start with Susa SimmondsAdair as the pharmacy currently has samples.  Patient is applying for various financial assistance.    2. Screening for diabetes mellitus -  Glucose (CBG) A1C is normal today.    Patient have been counseled extensively about nutrition and exercise  Return in about 1 month (around 07/21/2017) for assign PCP; recheck asthma.  The patient was given clear instructions to go to ER or return to medical center if symptoms don't improve, worsen or new problems develop. The patient verbalized understanding. The patient was told to call to get lab results if they haven't heard anything in the next week.     Georgian CoAngela Catalino Plascencia, PA-C [facility] [place], [place] [phone]   06/21/2017, 11:00 AM

## 2017-06-21 ENCOUNTER — Ambulatory Visit: Payer: Self-pay | Attending: Internal Medicine | Admitting: Physician Assistant

## 2017-06-21 VITALS — BP 115/75 | HR 88 | Temp 98.5°F | Resp 18 | Ht 68.0 in | Wt 154.6 lb

## 2017-06-21 DIAGNOSIS — Z131 Encounter for screening for diabetes mellitus: Secondary | ICD-10-CM

## 2017-06-21 DIAGNOSIS — J4540 Moderate persistent asthma, uncomplicated: Secondary | ICD-10-CM | POA: Insufficient documentation

## 2017-06-21 DIAGNOSIS — R739 Hyperglycemia, unspecified: Secondary | ICD-10-CM

## 2017-06-21 LAB — POCT GLYCOSYLATED HEMOGLOBIN (HGB A1C): Hemoglobin A1C: 5.4

## 2017-06-21 LAB — GLUCOSE, POCT (MANUAL RESULT ENTRY): POC GLUCOSE: 152 mg/dL — AB (ref 70–99)

## 2017-06-21 MED ORDER — FLUTICASONE-SALMETEROL 250-50 MCG/DOSE IN AEPB: 1.0000 | INHALATION_SPRAY | Freq: Two times a day (BID) | RESPIRATORY_TRACT | 3 refills | Status: DC

## 2017-06-21 MED FILL — !ADVAIR 250/50 DISKUS: 250-50 | 30 days supply | Qty: 60 | Fill #0

## 2017-06-26 ENCOUNTER — Ambulatory Visit: Payer: Self-pay | Attending: Physician Assistant

## 2017-08-06 ENCOUNTER — Encounter: Payer: Self-pay | Admitting: Family Medicine

## 2017-08-06 ENCOUNTER — Other Ambulatory Visit (HOSPITAL_COMMUNITY)
Admission: RE | Admit: 2017-08-06 | Discharge: 2017-08-06 | Disposition: A | Discharge status: Home / Self Care | Payer: Medicaid Other | Source: Ambulatory Visit | Attending: Family Medicine | Admitting: Family Medicine

## 2017-08-06 ENCOUNTER — Ambulatory Visit: Payer: Self-pay | Attending: Family Medicine | Admitting: Family Medicine

## 2017-08-06 VITALS — BP 99/65 | HR 87 | Temp 98.5°F | Resp 18 | Ht 67.0 in | Wt 152.4 lb

## 2017-08-06 DIAGNOSIS — Z113 Encounter for screening for infections with a predominantly sexual mode of transmission: Secondary | ICD-10-CM | POA: Insufficient documentation

## 2017-08-06 DIAGNOSIS — Z23 Encounter for immunization: Secondary | ICD-10-CM | POA: Insufficient documentation

## 2017-08-06 DIAGNOSIS — J4550 Severe persistent asthma, uncomplicated: Secondary | ICD-10-CM | POA: Insufficient documentation

## 2017-08-06 DIAGNOSIS — Z91013 Allergy to seafood: Secondary | ICD-10-CM | POA: Insufficient documentation

## 2017-08-06 DIAGNOSIS — Z0000 Encounter for general adult medical examination without abnormal findings: Secondary | ICD-10-CM

## 2017-08-06 DIAGNOSIS — Z79899 Other long term (current) drug therapy: Secondary | ICD-10-CM | POA: Insufficient documentation

## 2017-08-06 MED ORDER — MONTELUKAST SODIUM 10 MG PO TABS: 10.0000 mg | ORAL_TABLET | Freq: Every day | ORAL | 5 refills | Status: DC

## 2017-08-06 MED ORDER — ALBUTEROL SULFATE HFA 108 (90 BASE) MCG/ACT IN AERS
2.0000 | INHALATION_SPRAY | Freq: Four times a day (QID) | RESPIRATORY_TRACT | 3 refills | Status: DC | PRN
Start: 2017-08-06 — End: 2017-08-27

## 2017-08-06 MED ORDER — FLUTICASONE-SALMETEROL 500-50 MCG/DOSE IN AEPB: 1.0000 | INHALATION_SPRAY | Freq: Two times a day (BID) | RESPIRATORY_TRACT | 3 refills | Status: DC

## 2017-08-06 MED ORDER — EPINEPHRINE 0.3 MG/0.3ML IJ SOAJ: 0.3000 mg | Freq: Once | INTRAMUSCULAR | 1 refills | Status: AC

## 2017-08-06 NOTE — Patient Instructions (Signed)
Apply for orange card   Asthma Attack Prevention, Adult Although you may not be able to control the fact that you have asthma, you can take actions to prevent episodes of asthma (asthma attacks). These actions include:  Creating a written plan for managing and treating your asthma attacks (asthma action plan).  Monitoring your asthma.  Avoiding things that can irritate your airways or make your asthma symptoms worse (asthma triggers).  Taking your medicines as directed.  Acting quickly if you have signs or symptoms of an asthma attack.  What are some ways to prevent an asthma attack? Create a plan Work with your health care provider to create an asthma action plan. This plan should include:  A list of your asthma triggers and how to avoid them.  A list of symptoms that you experience during an asthma attack.  Information about when to take medicine and how much medicine to take.  Information to help you understand your peak flow measurements.  Contact information for your health care providers.  Daily actions that you can take to control asthma.  Monitor your asthma  To monitor your asthma:  Use your peak flow meter every morning and every evening for 2-3 weeks. Record the results in a journal. A drop in your peak flow numbers on one or more days may mean that you are starting to have an asthma attack, even if you are not having symptoms.  When you have asthma symptoms, write them down in a journal.  Avoid asthma triggers  Work with your health care provider to find out what your asthma triggers are. This can be done by:  Being tested for allergies.  Keeping a journal that notes when asthma attacks occur and what may have contributed to them.  Asking your health care provider whether other medical conditions make your asthma worse.  Common asthma triggers include:  Dust.  Smoke. This includes campfire smoke and secondhand smoke from tobacco products.  Pet  dander.  Trees, grasses or pollens.  Very cold, dry, or humid air.  Mold.  Foods that contain high amounts of sulfites.  Strong smells.  Engine exhaust and air pollution.  Aerosol sprays and fumes from household cleaners.  Household pests and their droppings, including dust mites and cockroaches.  Certain medicines, including NSAIDs.  Once you have determined your asthma triggers, take steps to avoid them. Depending on your triggers, you may be able to reduce the chance of an asthma attack by:  Keeping your home clean. Have someone dust and vacuum your home for you 1 or 2 times a week. If possible, have them use a high-efficiency particulate arrestance (HEPA) vacuum.  Washing your sheets weekly in hot water.  Using allergy-proof mattress covers and casings on your bed.  Keeping pets out of your home.  Taking care of mold and water problems in your home.  Avoiding areas where people smoke.  Avoiding using strong perfumes or odor sprays.  Avoid spending a lot of time outdoors when pollen counts are high and on very windy days.  Talking with your health care provider before stopping or starting any new medicines.  Medicines Take over-the-counter and prescription medicines only as told by your health care provider. Many asthma attacks can be prevented by carefully following your medicine schedule. Taking your medicines correctly is especially important when you cannot avoid certain asthma triggers. Even if you are doing well, do not stop taking your medicine and do not take less medicine. Act quickly If  an asthma attack happens, acting quickly can decrease how severe it is and how long it lasts. Take these actions:  Pay attention to your symptoms. If you are coughing, wheezing, or having difficulty breathing, do not wait to see if your symptoms go away on their own. Follow your asthma action plan.  If you have followed your asthma action plan and your symptoms are not  improving, call your health care provider or seek immediate medical care at the nearest hospital.  It is important to write down how often you need to use your fast-acting rescue inhaler. You can track how often you use an inhaler in your journal. If you are using your rescue inhaler more often, it may mean that your asthma is not under control. Adjusting your asthma treatment plan may help you to prevent future asthma attacks and help you to gain better control of your condition. How can I prevent an asthma attack when I exercise?  Exercise is a common asthma trigger. To prevent asthma attacks during exercise:  Follow advice from your health care provider about whether you should use your fast-acting inhaler before exercising. Many people with asthma experience exercise-induced bronchoconstriction (EIB). This condition often worsens during vigorous exercise in cold, humid, or dry environments. Usually, people with EIB can stay very active by using a fast-acting inhaler before exercising.  Avoid exercising outdoors in very cold or humid weather.  Avoid exercising outdoors when pollen counts are high.  Warm up and cool down when exercising.  Stop exercising right away if asthma symptoms start.  Consider taking part in exercises that are less likely to cause asthma symptoms such as:  Indoor swimming.  Biking.  Walking.  Hiking.  Playing football.  This information is not intended to replace advice given to you by your health care provider. Make sure you discuss any questions you have with your health care provider. Document Released: 09/13/2009 Document Revised: 05/26/2016 Document Reviewed: 03/11/2016 Elsevier Interactive Patient Education  2017 [employer].

## 2017-08-06 NOTE — Progress Notes (Signed)
Patient is here for asthma f/up

## 2017-08-06 NOTE — Progress Notes (Signed)
Subjective:  Patient ID: Mario Griffith, male    DOB: 01/26/1996  Age: 21 y.o. MRN: [id number]  CC: Asthma   HPI Mario Griffith presents to establish care. He reports history of asthma since birth. He reports adherence with medications for asthma. Uses albuterol inhaler BID. Asthma symptoms worsened by weather changes- especially cold weather, dust. Rescue inhaler BID. Patient request STI screening during health maintainance intake. He denies any penile lesions, discharge, or dysuria. He reports 1 sexual partner within the last 3 months.      Outpatient Medications Prior to Visit  Medication Sig Dispense Refill  . albuterol (PROVENTIL HFA;VENTOLIN HFA) 108 (90 Base) MCG/ACT inhaler Inhale 1-2 puffs into the lungs every 6 (six) hours as needed for wheezing or shortness of breath. 1 Inhaler 0  . albuterol (PROVENTIL HFA;VENTOLIN HFA) 108 (90 Base) MCG/ACT inhaler Inhale 1-2 puffs into the lungs every 6 (six) hours as needed for wheezing or shortness of breath. 1 Inhaler 0  . Fluticasone-Salmeterol (ADVAIR) 250-50 MCG/DOSE AEPB Inhale 1 puff into the lungs 2 (two) times daily. 60 each 3  . EPINEPHrine 0.3 mg/0.3 mL IJ SOAJ injection Inject 0.3 mLs (0.3 mg total) into the muscle once. (Patient not taking: Reported on 01/29/2017) 1 Device 1  . loratadine (CLARITIN) 10 MG tablet Take 1 tablet (10 mg total) by mouth daily. (Patient not taking: Reported on 01/29/2017) 7 tablet 0  . predniSONE (DELTASONE) 10 MG tablet Take 6 tablets (60 mg total) by mouth daily. 30 tablet 0   No facility-administered medications prior to visit.     ROS Review of Systems  Constitutional: Negative.   HENT: Negative.   Eyes: Negative.   Respiratory: Negative.   Cardiovascular: Negative.       Objective:  BP 99/65 (BP Location: Left Arm, Patient Position: Sitting, Cuff Size: Normal)   Pulse 87   Temp 98.5 F (36.9 C) (Oral)   Resp 18   Ht 5\' 7"  (1.702 m)   Wt 152 lb 6.4 oz (69.1 kg)   SpO2 97%   BMI  23.87 kg/m   BP/Weight 08/06/2017 06/21/2017 06/13/2017  Systolic BP 99 115 119  Diastolic BP 65 75 80  Wt. (Lbs) 152.4 154.6 156  BMI 23.87 23.51 23.72     Physical Exam  Constitutional: He appears well-developed and well-nourished.  HENT:  Head: Normocephalic and atraumatic.  Right Ear: External ear normal.  Left Ear: External ear normal.  Nose: Nose normal.  Mouth/Throat: Oropharynx is clear and moist.  Eyes: Pupils are equal, round, and reactive to light. Conjunctivae are normal.  Cardiovascular: Normal rate, regular rhythm, normal heart sounds and intact distal pulses.   Pulmonary/Chest: Effort normal and breath sounds normal.  Nursing note and vitals reviewed.    Assessment & Plan:   1. Severe persistent asthma without complication  - Fluticasone-Salmeterol (ADVAIR DISKUS) 500-50 MCG/DOSE AEPB; Inhale 1 puff into the lungs 2 (two) times daily.  Dispense: 60 each; Refill: 3 - albuterol (PROVENTIL HFA;VENTOLIN HFA) 108 (90 Base) MCG/ACT inhaler; Inhale 2 puffs into the lungs every 6 (six) hours as needed for wheezing or shortness of breath.  Dispense: 1 Inhaler; Refill: 3 - Ambulatory referral to Pulmonology - montelukast (SINGULAIR) 10 MG tablet; Take 1 tablet (10 mg total) by mouth at bedtime.  Dispense: 60 tablet; Refill: 5  2. Allergy to shellfish  - EPINEPHrine 0.3 mg/0.3 mL IJ SOAJ injection; Inject 0.3 mLs (0.3 mg total) into the muscle once.  Dispense: 1 Device; Refill: 1  3. Screening for STDs (sexually transmitted diseases)  - Urine cytology ancillary only - HEP, RPR, HIV Panel - HSV(herpes simplex vrs) 1+2 ab-IgG  4. Healthcare maintenance  - Tdap vaccine greater than or equal to 7yo IM  5. Needs flu shot  - Flu Vaccine QUAD 36+ mos IM   Meds ordered this encounter  Medications  . Fluticasone-Salmeterol (ADVAIR DISKUS) 500-50 MCG/DOSE AEPB    Sig: Inhale 1 puff into the lungs 2 (two) times daily.    Dispense:  60 each    Refill:  3    Order  Specific Question:   Supervising Provider    Answer:   Quentin Angst [id number]  . EPINEPHrine 0.3 mg/0.3 mL IJ SOAJ injection    Sig: Inject 0.3 mLs (0.3 mg total) into the muscle once.    Dispense:  1 Device    Refill:  1    Order Specific Question:   Supervising Provider    Answer:   Quentin Angst [id number]  . albuterol (PROVENTIL HFA;VENTOLIN HFA) 108 (90 Base) MCG/ACT inhaler    Sig: Inhale 2 puffs into the lungs every 6 (six) hours as needed for wheezing or shortness of breath.    Dispense:  1 Inhaler    Refill:  3    Order Specific Question:   Supervising Provider    Answer:   Quentin Angst [id number]  . montelukast (SINGULAIR) 10 MG tablet    Sig: Take 1 tablet (10 mg total) by mouth at bedtime.    Dispense:  60 tablet    Refill:  5    Order Specific Question:   Supervising Provider    Answer:   Quentin Angst [id number]    Follow-up: Return in about 6 weeks (around 09/17/2017), or if symptoms worsen or fail to improve, for Asthma.   Lizbeth Bark FNP

## 2017-08-07 LAB — HEP, RPR, HIV PANEL
HEP B S AG: NEGATIVE
HIV Screen 4th Generation wRfx: NONREACTIVE
RPR: NONREACTIVE

## 2017-08-07 LAB — HSV(HERPES SIMPLEX VRS) I + II AB-IGG
HSV 1 Glycoprotein G Ab, IgG: <0.91 index (ref 0.00–0.90)
HSV 2 IgG, Type Spec: <0.91 index (ref 0.00–0.90)

## 2017-08-08 ENCOUNTER — Telehealth: Payer: Self-pay

## 2017-08-08 LAB — URINE CYTOLOGY ANCILLARY ONLY
CHLAMYDIA, DNA PROBE: NEGATIVE
NEISSERIA GONORRHEA: NEGATIVE
TRICH (WINDOWPATH): NEGATIVE

## 2017-08-08 NOTE — Telephone Encounter (Signed)
CMA call regarding lab results   Patient verify DOB  Patient was aware and understood  

## 2017-08-08 NOTE — Telephone Encounter (Signed)
-----   Message from Lizbeth BarkMandesia R Hairston, FNP sent at 08/07/2017  9:27 PM EDT ----- HIV, Hepatitis B, and syphilis are all negative. Herpes negative. Still awaiting urine results.

## 2017-08-09 NOTE — Telephone Encounter (Signed)
CMA call patient regarding urine results   Patient did not answer & unable to leave message

## 2017-08-09 NOTE — Telephone Encounter (Signed)
-----   Message from Lizbeth BarkMandesia R Hairston, FNP sent at 08/08/2017 12:19 PM EDT ----- - Gonorrhea, Chlamydia, and Trichomonas were all negative.

## 2017-08-10 LAB — URINE CYTOLOGY ANCILLARY ONLY
Bacterial vaginitis: NEGATIVE
Candida vaginitis: NEGATIVE

## 2017-08-14 ENCOUNTER — Telehealth: Payer: Self-pay

## 2017-08-14 NOTE — Telephone Encounter (Signed)
-----   Message from Lizbeth BarkMandesia R Hairston, FNP sent at 08/13/2017  3:02 PM EST ----- Negative yeast and BV.

## 2017-08-14 NOTE — Telephone Encounter (Signed)
CMA call regarding urine results & yeast & BV   Patient was aware and understood

## 2017-08-26 ENCOUNTER — Encounter (HOSPITAL_COMMUNITY): Payer: Self-pay

## 2017-08-26 DIAGNOSIS — F1210 Cannabis abuse, uncomplicated: Secondary | ICD-10-CM | POA: Insufficient documentation

## 2017-08-26 DIAGNOSIS — Z79899 Other long term (current) drug therapy: Secondary | ICD-10-CM | POA: Insufficient documentation

## 2017-08-26 DIAGNOSIS — J4520 Mild intermittent asthma, uncomplicated: Secondary | ICD-10-CM | POA: Insufficient documentation

## 2017-08-26 DIAGNOSIS — J45909 Unspecified asthma, uncomplicated: Secondary | ICD-10-CM | POA: Insufficient documentation

## 2017-08-26 NOTE — ED Triage Notes (Signed)
Pt out of Albuterol inhaler, needs refill.

## 2017-08-27 ENCOUNTER — Emergency Department (HOSPITAL_COMMUNITY)
Admission: EM | Admit: 2017-08-27 | Discharge: 2017-08-27 | Disposition: A | Discharge status: Home / Self Care | Payer: Medicaid Other | Attending: Emergency Medicine | Admitting: Emergency Medicine

## 2017-08-27 DIAGNOSIS — Z760 Encounter for issue of repeat prescription: Secondary | ICD-10-CM

## 2017-08-27 DIAGNOSIS — J4520 Mild intermittent asthma, uncomplicated: Secondary | ICD-10-CM

## 2017-08-27 MED ORDER — IPRATROPIUM-ALBUTEROL 0.5-2.5 (3) MG/3ML IN SOLN
3.0000 mL | Freq: Once | RESPIRATORY_TRACT | Status: AC
  Administered 2017-08-27: 3 mL via RESPIRATORY_TRACT
  Filled 2017-08-27: qty 3

## 2017-08-27 MED ORDER — PREDNISONE 50 MG PO TABS: ORAL_TABLET | ORAL | 0 refills | Status: DC

## 2017-08-27 MED ORDER — PREDNISONE 20 MG PO TABS
60.0000 mg | ORAL_TABLET | Freq: Once | ORAL | Status: AC
  Administered 2017-08-27: 60 mg via ORAL
  Filled 2017-08-27: qty 3

## 2017-08-27 MED ORDER — ALBUTEROL SULFATE HFA 108 (90 BASE) MCG/ACT IN AERS
2.0000 | INHALATION_SPRAY | RESPIRATORY_TRACT | Status: DC | PRN
  Filled 2017-08-27: qty 6.7

## 2017-08-27 NOTE — Discharge Instructions (Signed)
Use the inhaler 2 puffs every 4 hours as needed. Follow up with your doctor.

## 2017-08-27 NOTE — ED Provider Notes (Signed)
[facility] Provider Note   CSN: [id number] Arrival date & time: 08/26/17  2227     History   Chief Complaint Chief Complaint  Patient presents with  . Medication Refill    HPI Mario Griffith is a 21 y.o. male who presents to the ED for a medication refill. Patient reports that he ran out of his Albuterol in haler yesterday and today started wheezing. He also request a neb treatment. Patient denies any other problems.   The history is provided by the patient. No language interpreter was used.  Medication Refill  Medications/supplies requested:  Albuterol inhaler Reason for request:  Medications ran out Wheezing   This is a recurrent problem. The current episode started 6 to 12 hours ago. The problem occurs constantly. The problem has been gradually worsening. Associated symptoms include cough. Pertinent negatives include no chest pain, no fever, no vomiting, no headaches and no rash. He has tried nothing for the symptoms. His past medical history is significant for asthma.    Past Medical History:  Diagnosis Date  . Asthma     There are no active problems to display for this patient.   Past Surgical History:  Procedure Laterality Date  . HERNIA REPAIR         Home Medications    Prior to Admission medications   Medication Sig Start Date End Date Taking? Authorizing Provider  Fluticasone-Salmeterol (ADVAIR DISKUS) 500-50 MCG/DOSE AEPB Inhale 1 puff into the lungs 2 (two) times daily. 08/06/17   Lizbeth BarkHairston, Mandesia R, FNP  montelukast (SINGULAIR) 10 MG tablet Take 1 tablet (10 mg total) by mouth at bedtime. 08/06/17   Lizbeth BarkHairston, Mandesia R, FNP  predniSONE (DELTASONE) 50 MG tablet Starting tomorrow take one tablet PO daily 08/27/17   Janne NapoleonNeese, Davisha Linthicum M, NP    Family History History reviewed. No pertinent family history.  Social History Social History   Tobacco Use  . Smoking status: Never Smoker  . Smokeless tobacco: Never  Used  Substance Use Topics  . Alcohol use: Yes    Comment: occasionally  . Drug use: Yes    Types: Marijuana    Comment: occasionally     Allergies   Shellfish allergy   Review of Systems Review of Systems  Constitutional: Negative for chills and fever.  HENT: Negative.   Respiratory: Positive for cough and wheezing.   Cardiovascular: Negative for chest pain.  Gastrointestinal: Negative for nausea and vomiting.  Musculoskeletal: Negative for neck stiffness.  Skin: Negative for rash.  Neurological: Negative for syncope and headaches.  Psychiatric/Behavioral: Negative for confusion.     Physical Exam Updated Vital Signs BP 123/85 (BP Location: Right Arm)   Pulse 68   Temp 98.1 F (36.7 C) (Oral)   Resp 16   SpO2 98%   Physical Exam  Constitutional: He is oriented to person, place, and time. He appears well-developed and well-nourished. No distress.  HENT:  Head: Normocephalic and atraumatic.  Mouth/Throat: Uvula is midline, oropharynx is clear and moist and mucous membranes are normal.  Eyes: EOM are normal.  Neck: Neck supple.  Cardiovascular: Normal rate and regular rhythm.  Pulmonary/Chest: Effort normal. He has wheezes.  Wheezing bilateral lungs  Abdominal: Soft. There is no tenderness.  Musculoskeletal: Normal range of motion.  Neurological: He is alert and oriented to person, place, and time. No cranial nerve deficit.  Skin: Skin is warm and dry.  Psychiatric: He has a normal mood and affect. His behavior is normal.  Nursing note and vitals reviewed.    ED Treatments / Results  Labs (all labs ordered are listed, but only abnormal results are displayed) Labs Reviewed - No data to display Radiology No results found.  Procedures Procedures (including critical care time)  Medications Ordered in ED Medications  albuterol (PROVENTIL HFA;VENTOLIN HFA) 108 (90 Base) MCG/ACT inhaler 2 puff (not administered)  ipratropium-albuterol (DUONEB) 0.5-2.5 (3)  MG/3ML nebulizer solution 3 mL (3 mLs Nebulization Given 08/27/17 0050)  predniSONE (DELTASONE) tablet 60 mg (60 mg Oral Given 08/27/17 0050)   Patient re examined after neb treatment and lungs have only occasional wheezing. Patient stable for d/c with O2 SAT of 98% on R/A. Will treat with short steroid burst and patient to f/u with PCP. Albuterol inhaler given to patient prior to d/c.   Initial Impression / Assessment and Plan / ED Course  I have reviewed the triage vital signs and the nursing notes.   Final Clinical Impressions(s) / ED Diagnoses   Final diagnoses:  Mild intermittent asthma without complication  Medication refill    ED Discharge Orders        Ordered    predniSONE (DELTASONE) 50 MG tablet     08/27/17 0111       Damian Leavelleese, BarbertonHope M, NP 08/27/17 0207    Nicanor AlconPalumbo, April, MD 08/27/17 [phone]

## 2017-08-28 ENCOUNTER — Ambulatory Visit (INDEPENDENT_AMBULATORY_CARE_PROVIDER_SITE_OTHER): Payer: Self-pay | Admitting: Internal Medicine

## 2017-08-28 ENCOUNTER — Other Ambulatory Visit (INDEPENDENT_AMBULATORY_CARE_PROVIDER_SITE_OTHER): Payer: Self-pay

## 2017-08-28 ENCOUNTER — Encounter: Payer: Self-pay | Admitting: Internal Medicine

## 2017-08-28 VITALS — BP 126/78 | HR 64 | Ht 67.0 in | Wt 147.4 lb

## 2017-08-28 DIAGNOSIS — J4540 Moderate persistent asthma, uncomplicated: Secondary | ICD-10-CM

## 2017-08-28 LAB — CBC WITH DIFFERENTIAL/PLATELET
Basophils Absolute: 0 10*3/uL (ref 0.0–0.1)
Basophils Relative: 0.6 % (ref 0.0–3.0)
EOS ABS: 0.6 10*3/uL (ref 0.0–0.7)
Eosinophils Relative: 8.2 % — ABNORMAL HIGH (ref 0.0–5.0)
HCT: 48 % (ref 39.0–52.0)
HEMOGLOBIN: 16 g/dL (ref 13.0–17.0)
Lymphocytes Relative: 31.9 % (ref 12.0–46.0)
Lymphs Abs: 2.4 10*3/uL (ref 0.7–4.0)
MCHC: 33.3 g/dL (ref 30.0–36.0)
MCV: 93.1 fl (ref 78.0–100.0)
Monocytes Absolute: 0.5 10*3/uL (ref 0.1–1.0)
Monocytes Relative: 7 % (ref 3.0–12.0)
Neutro Abs: 4 10*3/uL (ref 1.4–7.7)
Neutrophils Relative %: 52.3 % (ref 43.0–77.0)
Platelets: 284 10*3/uL (ref 150.0–400.0)
RBC: 5.16 Mil/uL (ref 4.22–5.81)
RDW: 13.6 % (ref 11.5–15.5)
WBC: 7.6 10*3/uL (ref 4.0–10.5)

## 2017-08-28 MED ORDER — BUDESONIDE-FORMOTEROL FUMARATE 160-4.5 MCG/ACT IN AERO: 2.0000 | INHALATION_SPRAY | Freq: Two times a day (BID) | RESPIRATORY_TRACT | 11 refills | Status: DC

## 2017-08-28 MED ORDER — BUDESONIDE-FORMOTEROL FUMARATE 160-4.5 MCG/ACT IN AERO: 2.0000 | INHALATION_SPRAY | Freq: Two times a day (BID) | RESPIRATORY_TRACT | 6 refills | Status: DC

## 2017-08-28 MED ORDER — ALBUTEROL SULFATE HFA 108 (90 BASE) MCG/ACT IN AERS: INHALATION_SPRAY | RESPIRATORY_TRACT | Status: DC

## 2017-08-28 NOTE — Progress Notes (Signed)
Subjective:     Patient ID: Mario Griffith, male   DOB: 11/26/1995,     MRN: [id number]  HPI  21  yobm never smoker with lifelong asthma born prematurely in [place] (? How premature) on daily treatment per pt since birth [facility] with lots of flares of sob esp run in cold weather referred to pulmonary clinic 08/28/2017 by  Dr Mario Griffith for dtc asthma    08/28/2017 1st  Pulmonary office visit/ Mario Griffith   Chief Complaint  Patient presents with  . Advice Only    REFERRED BY DR Mario Griffith, ASTHMA IS GETTING WORSE, SOB, CHEST TIGHTNESS   last advair was 11/19 am and last saba 15 h prior to OV despite daily symptoms of doe/ chest tightness s cough/ worse in cold weather s assoc nasal symptoms or clear triggers other than worse with uri's Had been on singulair previously but not taking it regularly, didn't seem to help Also waking most noct and needing saba despite advair 500 up to bid   No obvious other patterns in day to day or daytime variability or assoc excess/ purulent sputum or mucus plugs or hemoptysis or cp or  subjective wheeze or overt sinus or hb symptoms. No unusual exp hx or h/o childhood pna/ asthma or knowledge of premature birth.     Also denies any obvious fluctuation of symptoms with weather or environmental changes or other aggravating or alleviating factors except as outlined above   Current Allergies, Complete Past Medical History, Past Surgical History, Family History, and Social History were reviewed in [facility] electronic medical record.  ROS  The following are not active complaints unless bolded Hoarseness, sore throat, dysphagia, dental problems, itching, sneezing,  nasal congestion or discharge of excess mucus or purulent secretions, ear ache,   fever, chills, sweats, unintended wt loss or wt gain, classically pleuritic or exertional cp,  orthopnea pnd or leg swelling, presyncope, palpitations, abdominal pain, anorexia, nausea, vomiting,  diarrhea  or change in bowel habits or change in bladder habits, change in stools or change in urine, dysuria, hematuria,  rash, arthralgias, visual complaints, headache, numbness, weakness or ataxia or problems with walking or coordination,  change in mood/affect or memory.        Current Meds  Medication Sig  . [  Fluticasone-Salmeterol (ADVAIR DISKUS) 500-50 MCG/DOSE AEPB Inhale 1 puff into the lungs 2 (two) times daily.                            Review of Systems     Objective:   Physical Exam    amb bm nad/ somewhat stoic ? Denial re severity/ impact and potential M and M from poorly controlled asthma   Wt Readings from Last 3 Encounters:  08/28/17 147 lb 6.4 oz (66.9 kg)  08/06/17 152 lb 6.4 oz (69.1 kg)  06/21/17 154 lb 9.6 oz (70.1 kg)    Vital signs reviewed  - Note on arrival 02 sats  98% on RA     HEENT: nl dentition,  and oropharynx. Nl external ear canals without cough reflex - moderate bilateral non-specific turbinate edema     NECK :  without JVD/Nodes/TM/ nl carotid upstrokes bilaterally   LUNGS: no acc muscle use,  Nl contour chest with insp squeaks/ pops on insp and pan exp wheeze better with plm    CV:  RRR  no s3 or murmur or increase in  P2, and no edema   ABD:  soft and nontender with nl inspiratory excursion in the supine position. No bruits or organomegaly appreciated, bowel sounds nl  MS:  Nl gait/ ext warm without deformities, calf tenderness, cyanosis or clubbing No obvious joint restrictions   SKIN: warm and dry without lesions    NEURO:  alert, approp, nl sensorium with  no motor or cerebellar deficits apparent.     I personally reviewed images and agree with radiology impression as follows:  CXR:   01/29/17 No active cardiopulmonary disease.   Labs ordered 08/28/2017   Allergy profile      Assessment:

## 2017-08-28 NOTE — Patient Instructions (Signed)
Plan A = Automatic = Symbicort 160 Take 2 puffs first thing in am and then another 2 puffs about 12 hours later.     Work on inhaler technique:  relax and gently blow all the way out then take a nice smooth deep breath back in, triggering the inhaler at same time you start breathing in.  Hold for up to 5 seconds if you can. Blow out thru nose. Rinse and gargle with water when done (or brush teeth/ tongue and gargle)       Plan B = Backup Only use your albuterol (Proventil)  as a rescue medication to be used if you can't catch your breath by resting or doing a relaxed purse lip breathing pattern.  - The less you use it, the better it will work when you need it. - Ok to use the inhaler up to 2 puffs  every 4 hours if you must but call for appointment if use goes up over your usual need - Don't leave home without it !!  (think of it like the spare tire for your car)     Please schedule a follow up office visit in 4 weeks, sooner if needed with pfts on return

## 2017-08-29 LAB — RESPIRATORY ALLERGY PROFILE REGION II ~~LOC~~
ALLERGEN, A. ALTERNATA, M6: 6.96 kU/L — AB
ALLERGEN, COMM SILVER BIRCH, T3: 0.52 kU/L — AB
ALLERGEN, COTTONWOOD, T14: 1.52 kU/L — AB
Allergen, Cedar tree, t12: 4.62 kU/L — ABNORMAL HIGH
Allergen, D pternoyssinus,d7: 53.4 kU/L — ABNORMAL HIGH
Allergen, Mouse Urine Protein, e78: <0.1 kU/L
Allergen, Mulberry, t76: 0.62 kU/L — ABNORMAL HIGH
Allergen, Oak,t7: 1.3 kU/L — ABNORMAL HIGH
Allergen, P. notatum, m1: <0.1 kU/L
Aspergillus fumigatus, m3: 0.19 kU/L — ABNORMAL HIGH
BERMUDA GRASS: 1.18 kU/L — AB
Box Elder IgE: 1.6 kU/L — ABNORMAL HIGH
CLADOSPORIUM HERBARUM (M2) IGE: 0.34 kU/L — AB
CLASS: 0
CLASS: 0
CLASS: 1
CLASS: 2
CLASS: 2
CLASS: 2
CLASS: 3
CLASS: 3
COMMON RAGWEED (SHORT) (W1) IGE: 1.48 kU/L — AB
Cat Dander: 1.49 kU/L — ABNORMAL HIGH
Class: 0
Class: 0
Class: 1
Class: 2
Class: 2
Class: 2
Class: 2
Class: 2
Class: 2
Class: 2
Class: 2
Class: 2
Class: 2
Class: 5
Class: 5
Class: 6
Cockroach: >100 kU/L — ABNORMAL HIGH
D. farinae: 80.4 kU/L — ABNORMAL HIGH
DOG DANDER: 1.18 kU/L — AB
ELM IGE: 1.53 kU/L — AB
IGE (IMMUNOGLOBULIN E), SERUM: 1775 kU/L — AB (ref <=114)
JOHNSON GRASS: 1.73 kU/L — AB
PECAN/HICKORY TREE IGE: 1.9 kU/L — AB
Rough Pigweed  IgE: 2.49 kU/L — ABNORMAL HIGH
SHEEP SORREL IGE: 1.68 kU/L — AB
Timothy Grass: 2.09 kU/L — ABNORMAL HIGH

## 2017-08-29 LAB — INTERPRETATION:

## 2017-08-30 ENCOUNTER — Encounter: Payer: Self-pay | Admitting: Internal Medicine

## 2017-08-30 NOTE — Assessment & Plan Note (Addendum)
FENO 08/28/2017  =    107 - Spirometry 08/28/2017  FEV1 1.92 (5%)  Ratio 63 with mod curvature with no rx prior  - Allergy profile  08/28/17  >  Eos 0.6 /  IgE  1775 RAST diffusely pos esp cockroach/ mold ? ABPA? - 08/28/2017  After extensive coaching HFA effectiveness =    75%> try symb 160 2bid x 4 week samples   DDX of  difficult airways management almost all start with A and  include Adherence, Ace Inhibitors, Acid Reflux, Active Sinus Disease, Alpha 1 Antitripsin deficiency, Anxiety masquerading as Airways dz,  ABPA,  Allergy(esp in young), Aspiration (esp in elderly), Adverse effects of meds,  Active smokers, A bunch of PE's (a small clot burden can't cause this syndrome unless there is already severe underlying pulm or vascular dz with poor reserve) plus two Bs  = Bronchiectasis and Beta blocker use..and one C= CHF   In this case Adherence is the biggest issue and starts with  inability to use HFA effectively and also  understand that SABA treats the symptoms but doesn't get to the underlying problem (inflammation).  I used  the analogy of putting steroid cream on a rash to help explain the meaning of topical therapy and the need to get the drug to the target tissue.   - see hfa teaching - samples given - formulary issues reviewed - return with all meds in hand using a trust but verify approach to confirm accurate Medication  Reconciliation The principal here is that until we are certain that the  patients are doing what we've asked, it makes no sense to ask them to do more.   Allergy component >  FENo strongly suggests eos driven airways dz c/w allergic astham > rx  avoidance for now, high dose ics and consider rechallenge with singulair (doubt he really used it consistently) with  low threshold for allergy consultation or rx with IL5 inhibitor of fosenra  ? Adverse effects of dpi > change to high dose hfa ics = symb 160 2bid  ? ABPA > needs Aspergillos specific ab testing next ov    Total time devoted to counseling  > 50 % of initial 60 min office visit:  review case with pt/ discussion of options/alternatives/ personally creating written customized instructions  in presence of pt  then going over those specific  Instructions directly with the pt including how to use all of the meds but in particular covering each new medication in detail and the difference between the maintenance= "automatic" meds and the prns using an action plan format for the latter (If this problem/symptom => do that organization reading Left to right).  Please see AVS from this visit for a full list of these instructions which I personally wrote for this pt and  are unique to this visit.

## 2017-09-17 ENCOUNTER — Ambulatory Visit: Payer: Self-pay | Admitting: Family Medicine

## 2017-09-26 ENCOUNTER — Ambulatory Visit: Payer: Self-pay | Admitting: Family Medicine

## 2017-10-03 ENCOUNTER — Encounter: Payer: Self-pay | Admitting: Internal Medicine

## 2017-10-03 ENCOUNTER — Ambulatory Visit (INDEPENDENT_AMBULATORY_CARE_PROVIDER_SITE_OTHER): Payer: Self-pay | Admitting: Internal Medicine

## 2017-10-03 VITALS — BP 116/62 | HR 64 | Ht 67.0 in | Wt 153.0 lb

## 2017-10-03 DIAGNOSIS — J4540 Moderate persistent asthma, uncomplicated: Secondary | ICD-10-CM

## 2017-10-03 LAB — PULMONARY FUNCTION TEST
DL/VA % pred: 118 %
DL/VA: 5.33 ml/min/mmHg/L
DLCO COR % PRED: 94 %
DLCO UNC % PRED: 99 %
DLCO UNC: 28.07 ml/min/mmHg
DLCO cor: 26.74 ml/min/mmHg
FEF 25-75 POST: 2.13 L/s
FEF 25-75 PRE: 2.79 L/s
FEF2575-%Change-Post: -23 %
FEF2575-%PRED-POST: 50 %
FEF2575-%Pred-Pre: 65 %
FEV1-%Change-Post: -6 %
FEV1-%Pred-Post: 80 %
FEV1-%Pred-Pre: 86 %
FEV1-POST: 2.94 L
FEV1-PRE: 3.16 L
FEV1FVC-%Change-Post: -3 %
FEV1FVC-%PRED-PRE: 92 %
FEV6-%CHANGE-POST: -2 %
FEV6-%PRED-POST: 91 %
FEV6-%Pred-Pre: 94 %
FEV6-POST: 3.87 L
FEV6-Pre: 3.99 L
FEV6FVC-%Change-Post: 0 %
FEV6FVC-%PRED-POST: 100 %
FEV6FVC-%Pred-Pre: 100 %
FVC-%CHANGE-POST: -3 %
FVC-%Pred-Post: 90 %
FVC-%Pred-Pre: 93 %
FVC-Post: 3.88 L
FVC-Pre: 4.01 L
POST FEV1/FVC RATIO: 76 %
PRE FEV1/FVC RATIO: 79 %
Post FEV6/FVC ratio: 100 %
Pre FEV6/FVC Ratio: 99 %
RV % PRED: 131 %
RV: 1.69 L
TLC % PRED: 89 %
TLC: 5.58 L

## 2017-10-03 MED ORDER — BUDESONIDE-FORMOTEROL FUMARATE 160-4.5 MCG/ACT IN AERO: 2.0000 | INHALATION_SPRAY | Freq: Two times a day (BID) | RESPIRATORY_TRACT | 0 refills | Status: DC

## 2017-10-03 MED ORDER — BUDESONIDE-FORMOTEROL FUMARATE 160-4.5 MCG/ACT IN AERO: 2.0000 | INHALATION_SPRAY | Freq: Two times a day (BID) | RESPIRATORY_TRACT | 11 refills | Status: DC

## 2017-10-03 MED ORDER — ALBUTEROL SULFATE HFA 108 (90 BASE) MCG/ACT IN AERS: INHALATION_SPRAY | RESPIRATORY_TRACT | Status: AC

## 2017-10-03 NOTE — Progress Notes (Signed)
Subjective:    Patient ID: Mario Griffith, male   DOB: 11/29/1995      MRN: [id number]    Brief patient profile:  21  yobm never smoker with lifelong asthma born prematurely in [place] (? How premature) on daily treatment per pt since birth [facility] with lots of flares of sob esp run in cold weather referred to pulmonary clinic 08/28/2017 by  Dr Jenelle MagesHairston for dtc asthma      History of Present Illness  08/28/2017 1st [facility] office visit/ W[facility]   Chief Complaint  Patient presents with  . Advice Only    REFERRED BY DR Jenelle MagesHAIRSTON, ASTHMA IS GETTING WORSE, SOB, CHEST TIGHTNESS   last advair was 08/27/17  am and last saba 15 h prior to OV despite daily symptoms of doe/ chest tightness s cough/ worse in cold weather s assoc nasal symptoms or clear triggers other than worse with uri's Had been rec to take singulair > never filled rx  Also waking most noct and needing saba despite advair 500 up to bid  rec Plan A = Automatic = Symbicort 160 Take 2 puffs first thing in am and then another 2 puffs about 12 hours later.  Work on inhaler technique:  Plan B = Backup Only use your albuterol (Proventil)  as a rescue medication  Please schedule a follow up office visit in 4 weeks, sooner if needed with pfts on return   10/03/2017  f/u ov/Wert re:  Chronic asthma very atopic  Chief Complaint  Patient presents with  . Follow-up    follow up PFT,sob w/ activity   presently doing fine and last rescue 2 weeks prior to OV   Really only sob with heavy exertion   No obvious day to day or daytime variability or assoc excess/ purulent sputum or mucus plugs or hemoptysis or cp or chest tightness, subjective wheeze or overt sinus or hb symptoms. No unusual exposure hx or h/o childhood pna/ asthma or knowledge of premature birth.  Sleeping ok flat without nocturnal  or early am exacerbation  of respiratory  c/o's or need for noct saba. Also denies any obvious fluctuation of  symptoms with weather or environmental changes or other aggravating or alleviating factors except as outlined above   Current Allergies, Complete Past Medical History, Past Surgical History, Family History, and Social History were reviewed in [facility] electronic [facility] record.  ROS  The following are not active complaints unless bolded Hoarseness, sore throat, dysphagia, dental problems, itching, sneezing,  nasal congestion or discharge of excess mucus or purulent secretions, ear ache,   fever, chills, sweats, unintended wt loss or wt gain, classically pleuritic or exertional cp,  orthopnea pnd or leg swelling, presyncope, palpitations, abdominal pain, anorexia, nausea, vomiting, diarrhea  or change in bowel habits or change in bladder habits, change in stools or change in urine, dysuria, hematuria,  rash, arthralgias, visual complaints, headache, numbness, weakness or ataxia or problems with walking or coordination,  change in mood/affect or memory.        Current Meds  Medication Sig  . albuterol (PROAIR HFA) 108 (90 Base) MCG/ACT inhaler 2 puffs every 4 hours as needed only  if your can't catch your breath        .   budesonide-formoterol (SYMBICORT) 160-4.5 MCG/ACT inhaler Inhale 2 puffs into the lungs 2 (two) times daily.              Objective:  Physical Exam    amb bm nad     10/03/2017     153   08/28/17 147 lb 6.4 oz (66.9 kg)  08/06/17 152 lb 6.4 oz (69.1 kg)  06/21/17 154 lb 9.6 oz (70.1 kg)    Vital signs reviewed - Note on arrival 02 sats  98% on RA            HEENT: nl dentition, turbinates bilaterally, and oropharynx. Nl external ear canals without cough reflex   NECK :  without JVD/Nodes/TM/ nl carotid upstrokes bilaterally   LUNGS: no acc muscle use,  Nl contour chest which is clear to A and P bilaterally without cough on insp or exp maneuvers   CV:  RRR  no s3 or murmur or increase in P2, and no edema   ABD:  soft and nontender with nl  inspiratory excursion in the supine position. No bruits or organomegaly appreciated, bowel sounds nl  MS:  Nl gait/ ext warm without deformities, calf tenderness, cyanosis or clubbing No obvious joint restrictions   SKIN: warm and dry without lesions    NEURO:  alert, approp, nl sensorium with  no motor or cerebellar deficits apparent.          Assessment:

## 2017-10-03 NOTE — Progress Notes (Signed)
PFT done today. 

## 2017-10-03 NOTE — Patient Instructions (Addendum)
Plan A = Automatic = Symbicort 160  Take 2 puffs first thing in am and then another 2 puffs about 12 hours later.    Plan B = Backup Only use your albuterol as a rescue medication to be used if you can't catch your breath by resting or doing a relaxed purse lip breathing pattern.  - The less you use it, the better it will work when you need it. - Ok to use the inhaler up to 2 puffs  every 4 hours if you must but call for appointment if use goes up over your usual need - Don't leave home without it !!  (think of it like the spare tire for your car)    Please schedule a follow up visit in 6  months but call sooner if needed    

## 2017-10-04 ENCOUNTER — Other Ambulatory Visit: Payer: Self-pay | Admitting: *Deleted

## 2017-10-04 ENCOUNTER — Telehealth: Payer: Self-pay | Admitting: *Deleted

## 2017-10-04 MED ORDER — BUDESONIDE-FORMOTEROL FUMARATE 160-4.5 MCG/ACT IN AERO: 2.0000 | INHALATION_SPRAY | Freq: Two times a day (BID) | RESPIRATORY_TRACT | 11 refills | Status: AC

## 2017-10-04 NOTE — Telephone Encounter (Signed)
Pt called and informed that symbicort prescription was denied per insurance at [facility]. [facility]ymbicort prescription [facility] to [facility]. Pt informed that [facility] should be able to give him patient assistance with prescription.

## 2017-10-10 ENCOUNTER — Encounter: Payer: Self-pay | Admitting: Internal Medicine

## 2017-10-10 NOTE — Assessment & Plan Note (Addendum)
FENO 08/28/2017  =    107 - Spirometry 08/28/2017  FEV1 1.92 (52%)  Ratio 63 with mod curvature with no rx prior  - Allergy profile  08/28/17  >  Eos 0.6 /  IgE  1775 RAST diffusely pos esp cockroach/ mold ? ABPA? - 08/28/2017   try symb 160 2bid x 4 week samples> still with 40 pffs back at 4 week ov 10/03/2017  -  PFT's  10/03/2017  wnl p am symbicort 160   10/03/2017  After extensive coaching inhaler device  effectiveness =    90%   As long as on adequate maint rx >> All goals of chronic asthma control met including optimal function and elimination of symptoms with minimal need for rescue therapy.  Contingencies discussed in full including contacting this office immediately if not controlling the symptoms using the rule of two's.   Formulary restrictions will be an ongoing challenge for the forseable future and I would be happy to pick an alternative if the pt will first  provide me a list of them but pt  will need to return here for training for any new device that is required eg dpi vs hfa vs respimat.    In meantime we can always provide samples so the patient never runs out of any needed respiratory medications.    Each maintenance medication was reviewed in detail including most importantly the difference between maintenance and as needed and under what circumstances the prns are to be used.  Please see AVS for specific  Instructions which are unique to this visit and I personally typed out  which were reviewed in detail in writing with the patient and a copy provided.

## 2017-11-03 ENCOUNTER — Encounter (HOSPITAL_COMMUNITY): Payer: Self-pay | Admitting: Emergency Medicine

## 2017-11-03 ENCOUNTER — Emergency Department (HOSPITAL_COMMUNITY)
Admission: EM | Admit: 2017-11-03 | Discharge: 2017-11-03 | Disposition: A | Discharge status: Home / Self Care | Payer: Self-pay | Attending: Emergency Medicine | Admitting: Emergency Medicine

## 2017-11-03 ENCOUNTER — Emergency Department (HOSPITAL_COMMUNITY): Payer: Self-pay

## 2017-11-03 DIAGNOSIS — M545 Low back pain, unspecified: Secondary | ICD-10-CM

## 2017-11-03 DIAGNOSIS — Z79899 Other long term (current) drug therapy: Secondary | ICD-10-CM | POA: Insufficient documentation

## 2017-11-03 DIAGNOSIS — J4542 Moderate persistent asthma with status asthmaticus: Secondary | ICD-10-CM | POA: Insufficient documentation

## 2017-11-03 MED ORDER — IBUPROFEN 800 MG PO TABS: 800.0000 mg | ORAL_TABLET | Freq: Three times a day (TID) | ORAL | 0 refills | Status: AC
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
## 2017-11-03 NOTE — Discharge Instructions (Signed)
Return if any problems.  See your Physician for recheck in 1 week if pain persist °

## 2017-11-03 NOTE — ED Notes (Signed)
ED Provider at bedside. 
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
## 2018-04-03 ENCOUNTER — Ambulatory Visit: Payer: Medicaid Other | Admitting: Internal Medicine
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
